# Patient Record
Sex: Male | Born: 1960 | Race: White | Hispanic: No | Marital: Married
Health system: Midwestern US, Academic
[De-identification: ages and names within clinical notes are randomized; demographics above are authoritative.]

---

## 2016-06-16 IMAGING — CT Head^1_SINUS (Adult)
1 series · 16 of 30 positions shown, 20 images · non-contrast
Comparison: none

[Series 4: sinus 3.0 soft tissue · axial · 0.40mm/px · z∈[+48,+135]mm · 16 of 31 slices shown, 20 images]
[im 2/31  brain]
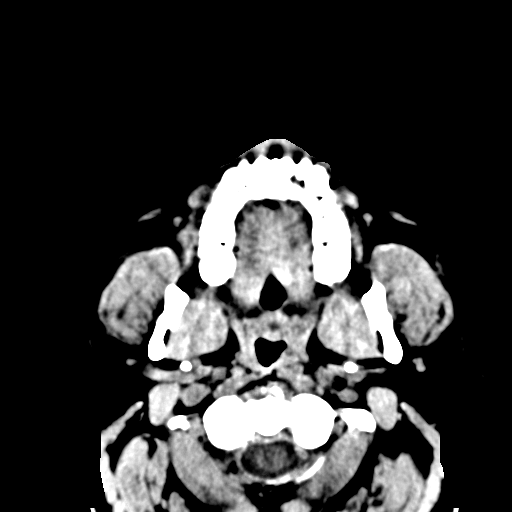
[im 2/31  bone]
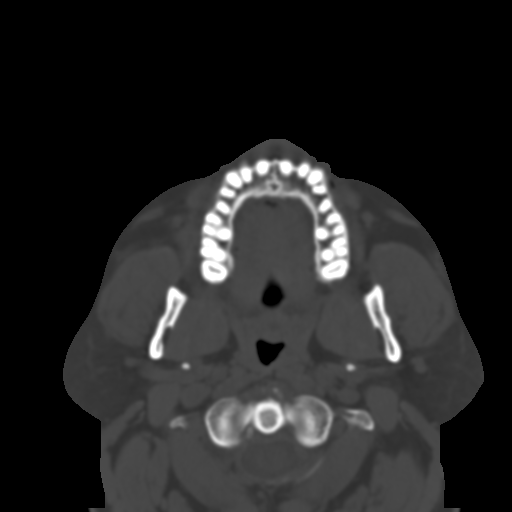
[im 4/31  bone]
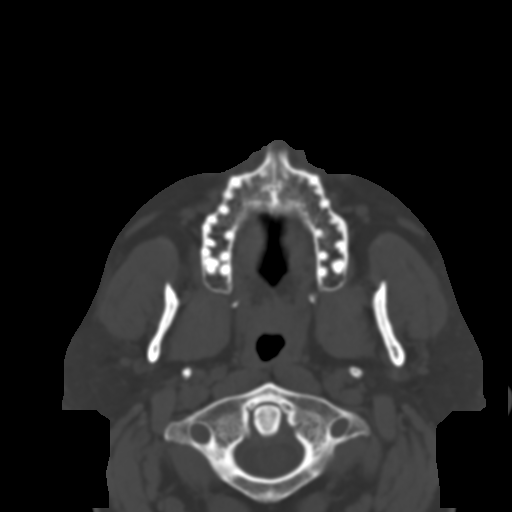
[im 6/31  bone]
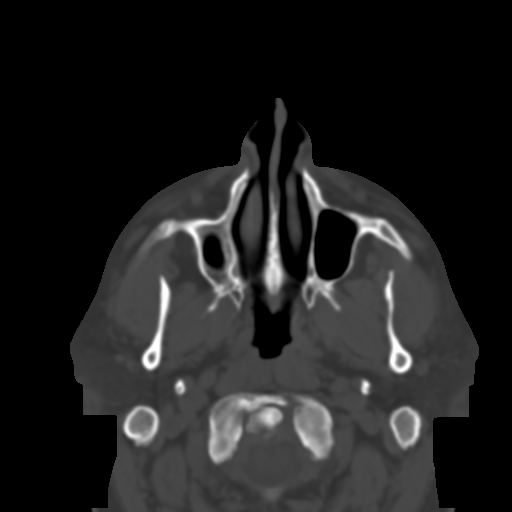
[im 8/31  bone]
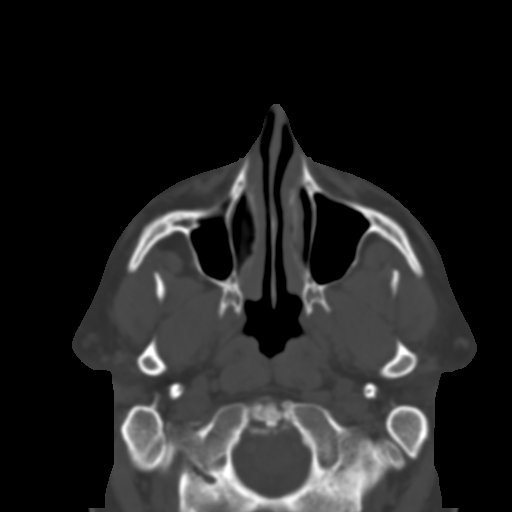
[im 9/31  brain]
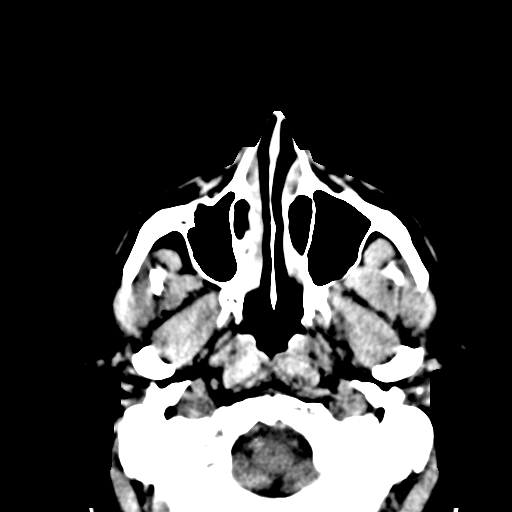
[im 9/31  bone]
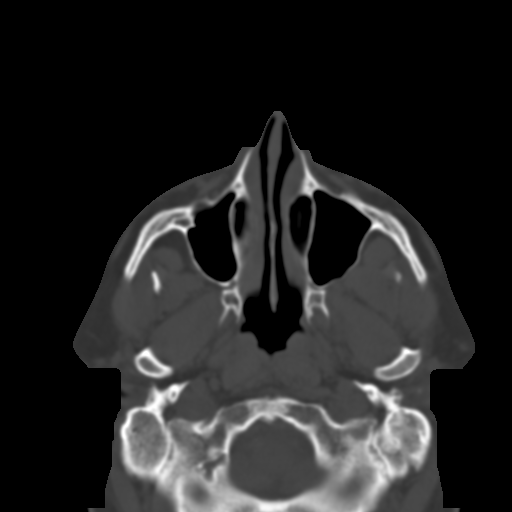
[im 11/31  bone]
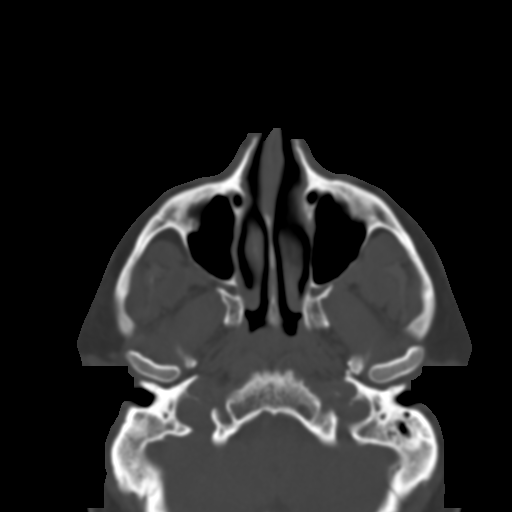
[im 13/31  bone]
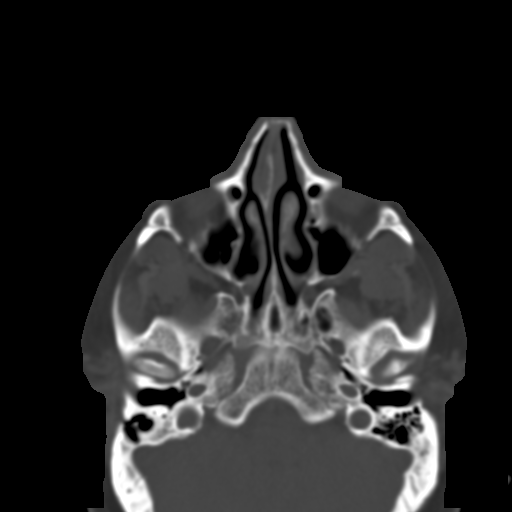
[im 15/31  bone]
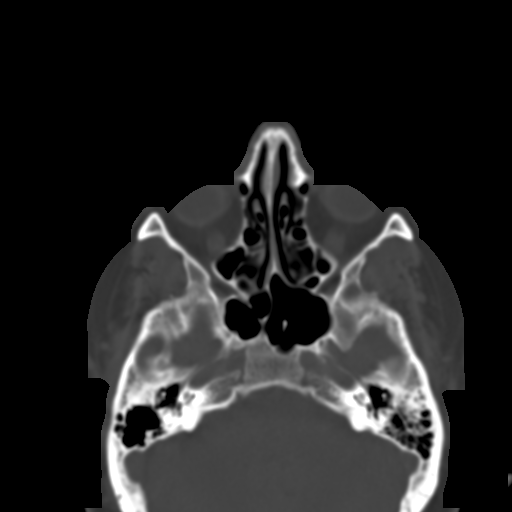
[im 16/31  brain]
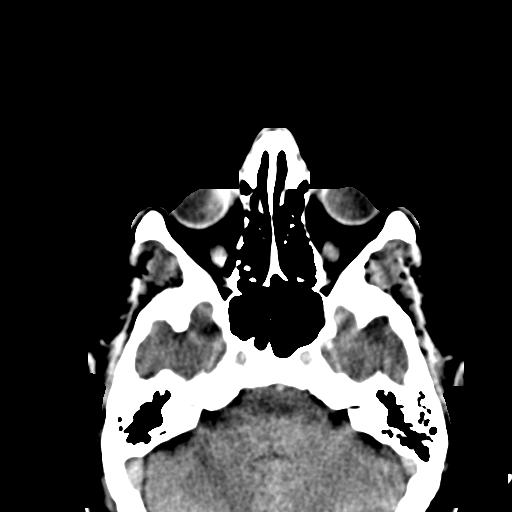
[im 16/31  bone]
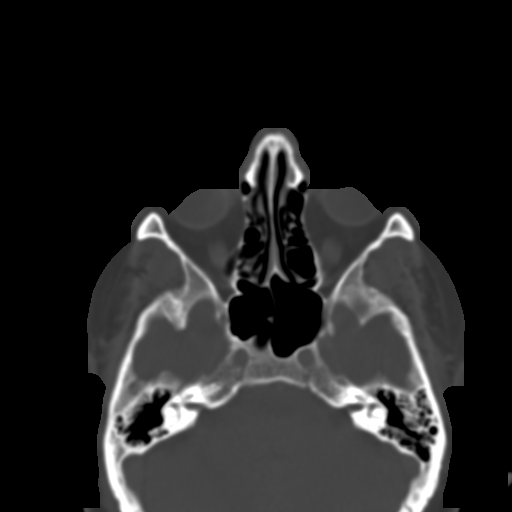
[im 18/31  bone]
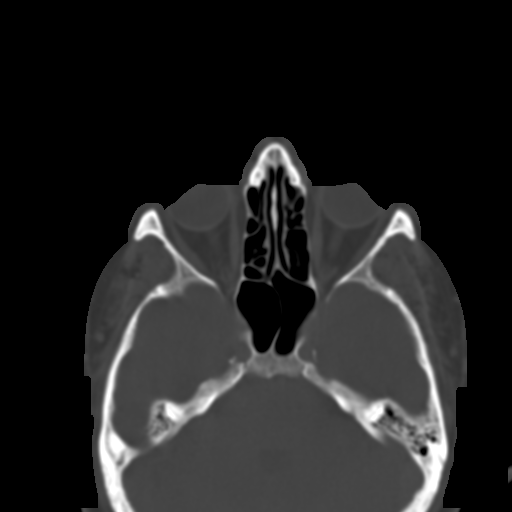
[im 20/31  bone]
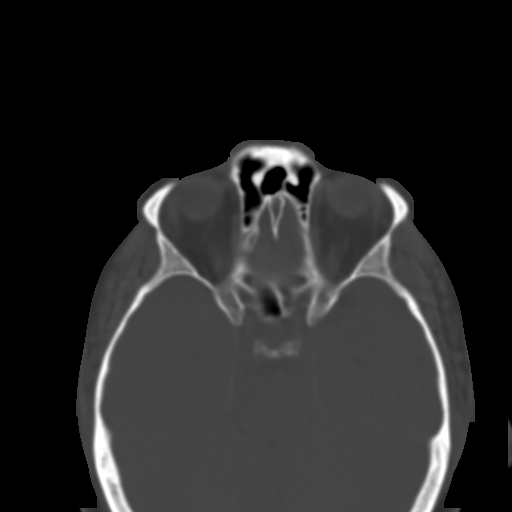
[im 22/31  bone]
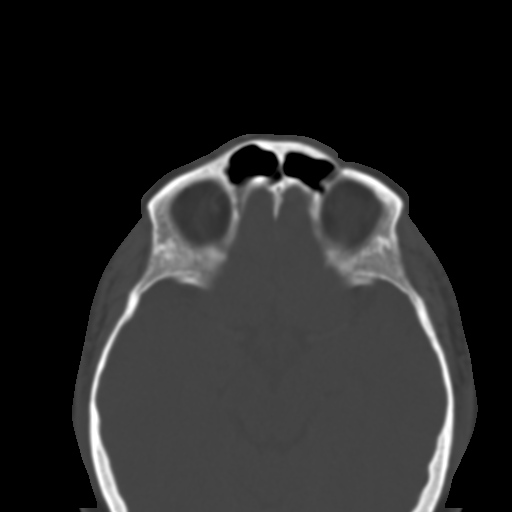
[im 23/31  brain]
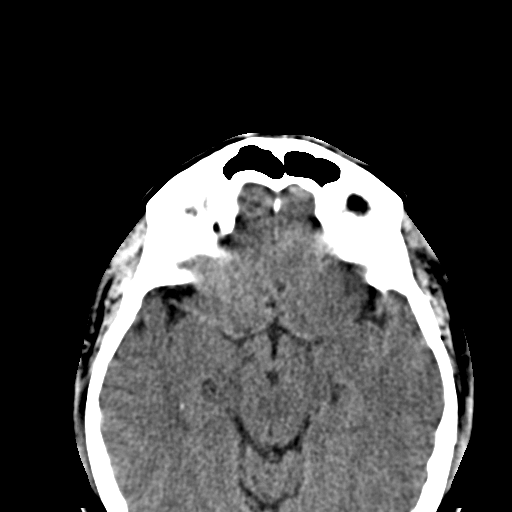
[im 23/31  bone]
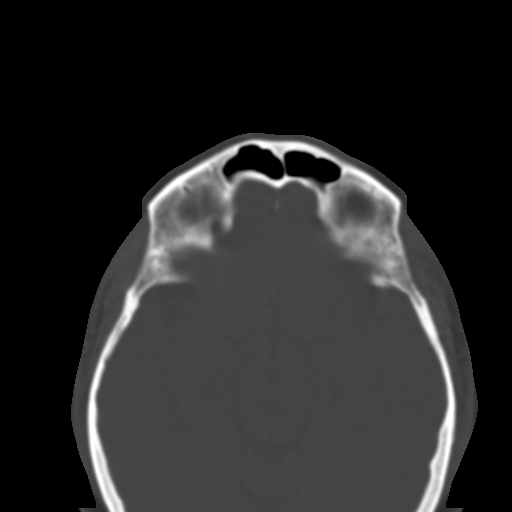
[im 25/31  bone]
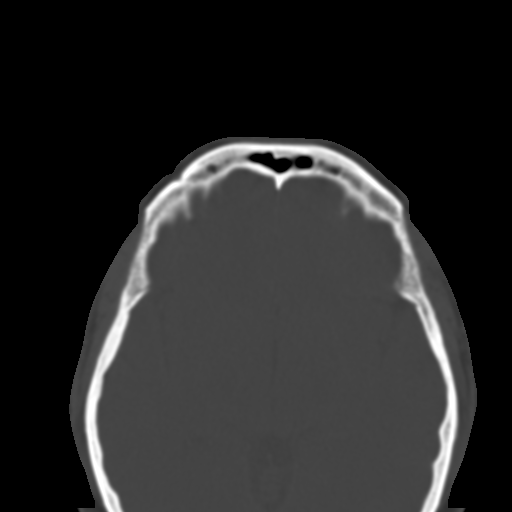
[im 27/31  bone]
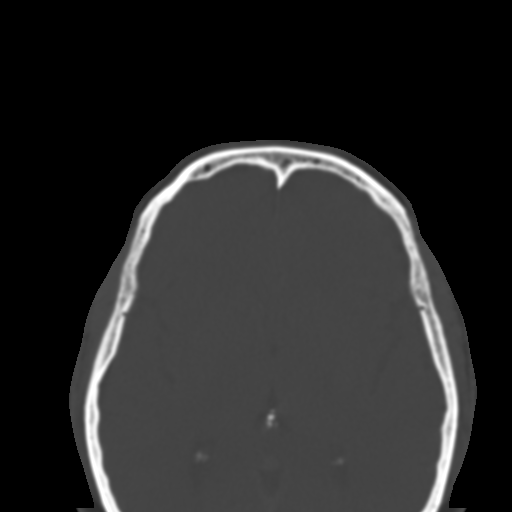
[im 29/31  bone]
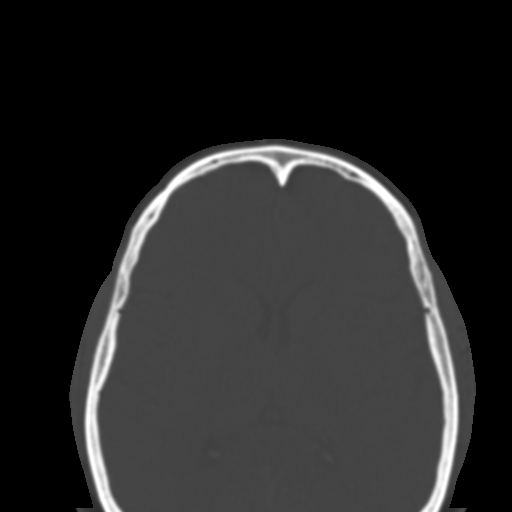

[16 of 30 positions shown; findings below may reference images not displayed]

DIAGNOSTIC STUDIES

EXAM

COMPUTED TOMOGRAPHY, MAXILLOFACIAL AREA; WITHOUT CONTRAST MATERIAL CPT 90331

INDICATION

chronic sinus congestion and pain
CHRONIC SINUS CONGESTION AND PAIN X 2 YEARS. CK/ME

TECHNIQUE

Contiguous axial images were obtained through the facial bones. Coronal and sagittal images were
reconstructed.

All CT scans at this facility use dose modulation, iterative reconstruction, and/or weight based
dosing when appropriate to reduce radiation dose to as low as reasonably achievable.

COMPARISONS

No priors available for comparison.

FINDINGS

The frontal sinuses, ethmoid air cells and sphenoid sinuses are well aerated. The maxillary
sinuses are well aerated without evidence of mucosal thickening or air fluid levels.  The
osteomeatal units are visualized. The infundibula are patent. There is mild deviation of the nasal
septum to the right, 4 millimeter. The mastoid air cells appear well aerated.

IMPRESSION

1. No evidence of acute or chronic sinusitis.

## 2016-10-16 IMAGING — CR UP_EXM
4 series · 4 of 4 positions shown · non-contrast
Comparison: none

[elbow ap]
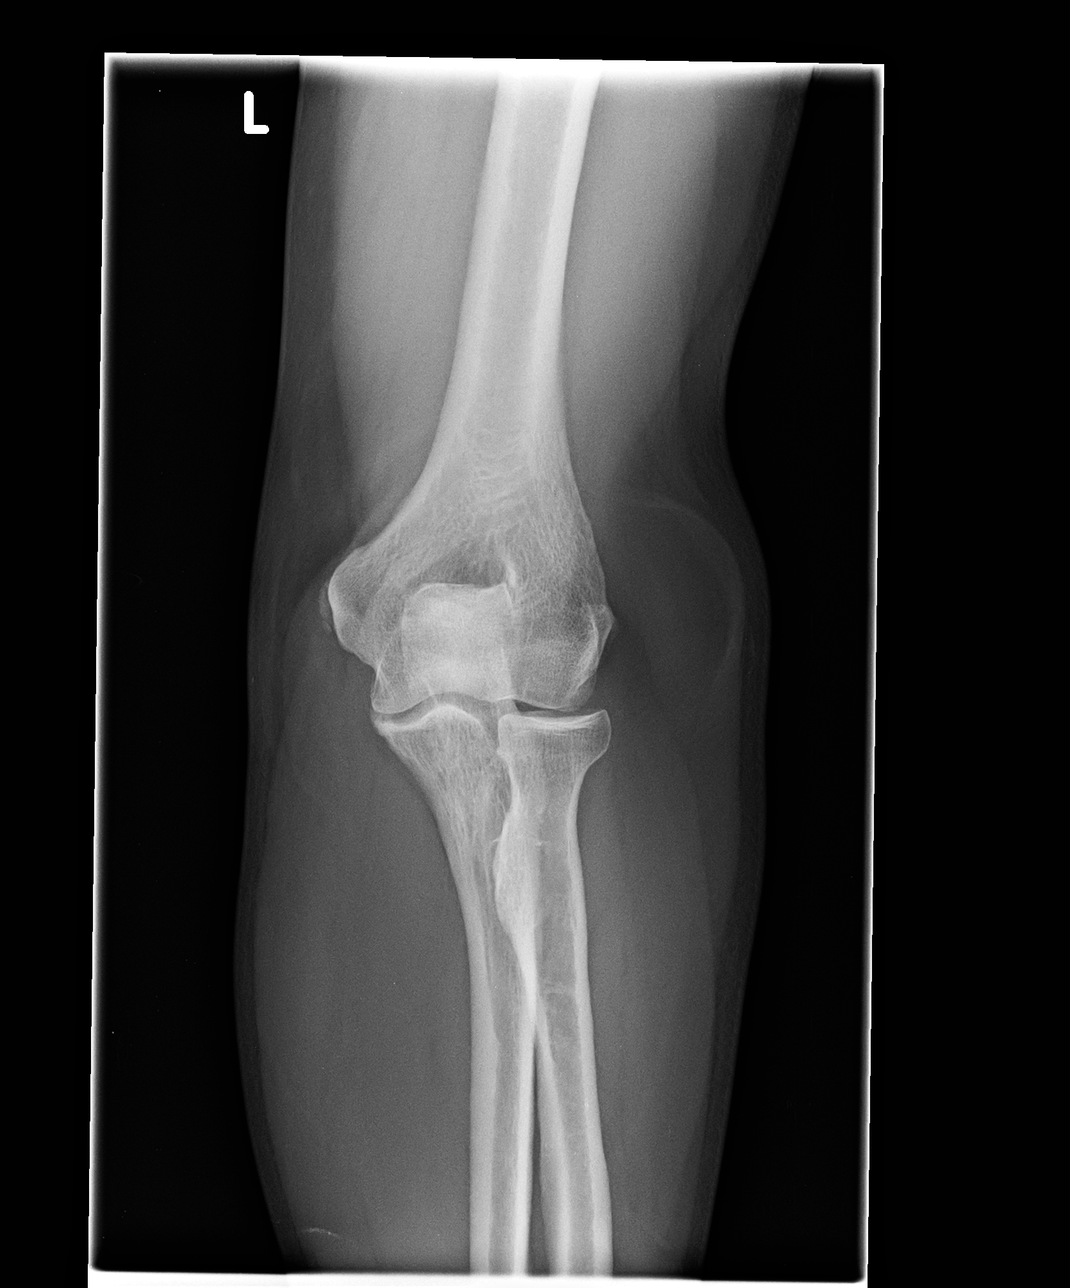

[elbow obl (1 of 2)]
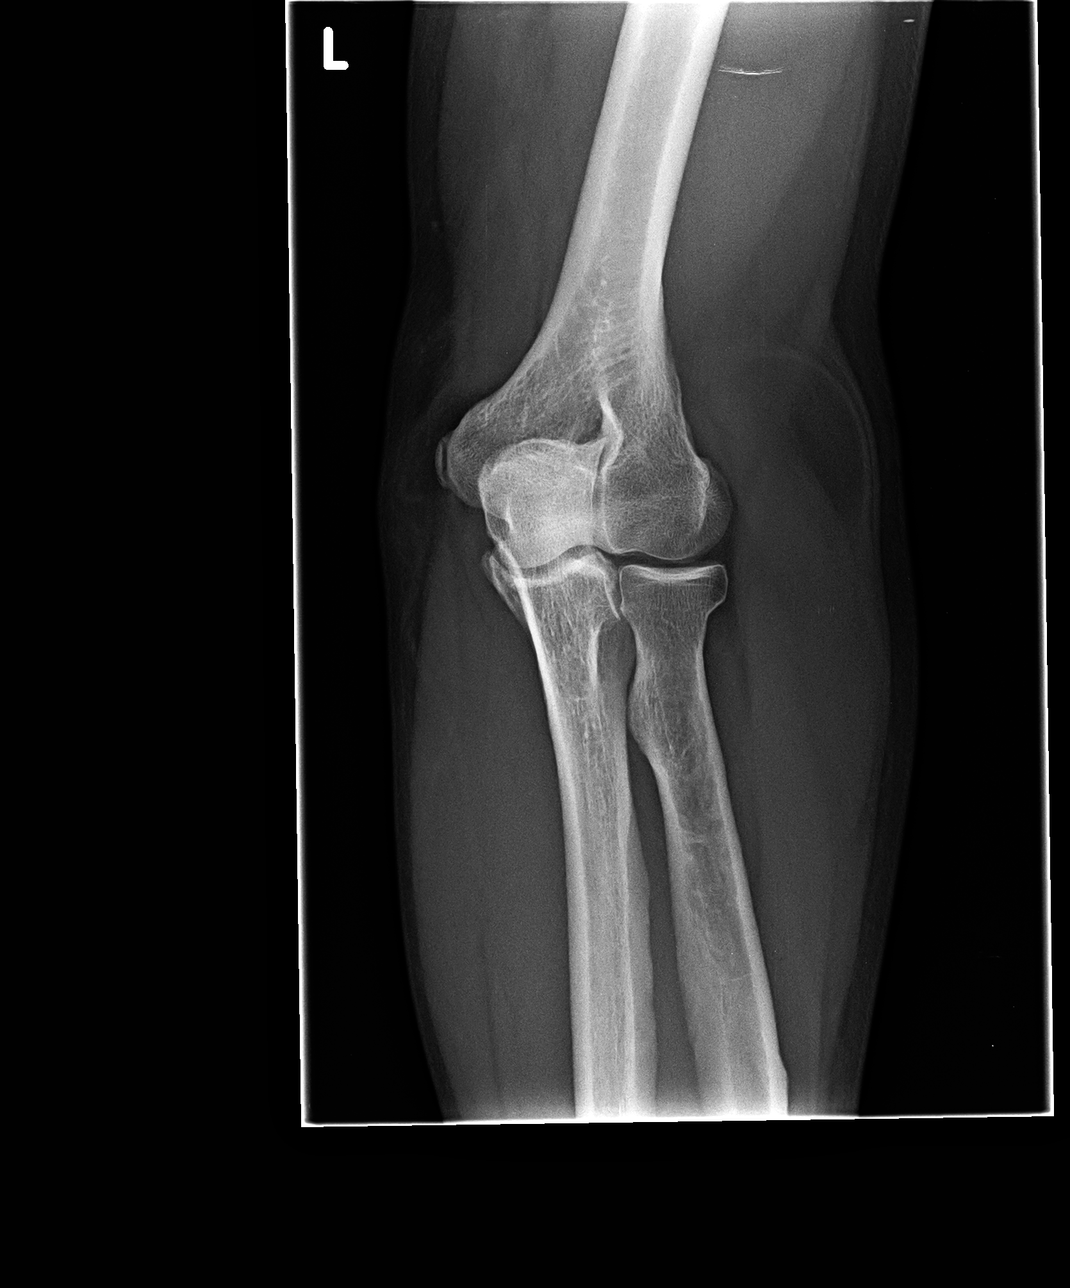

[elbow obl (2 of 2)]
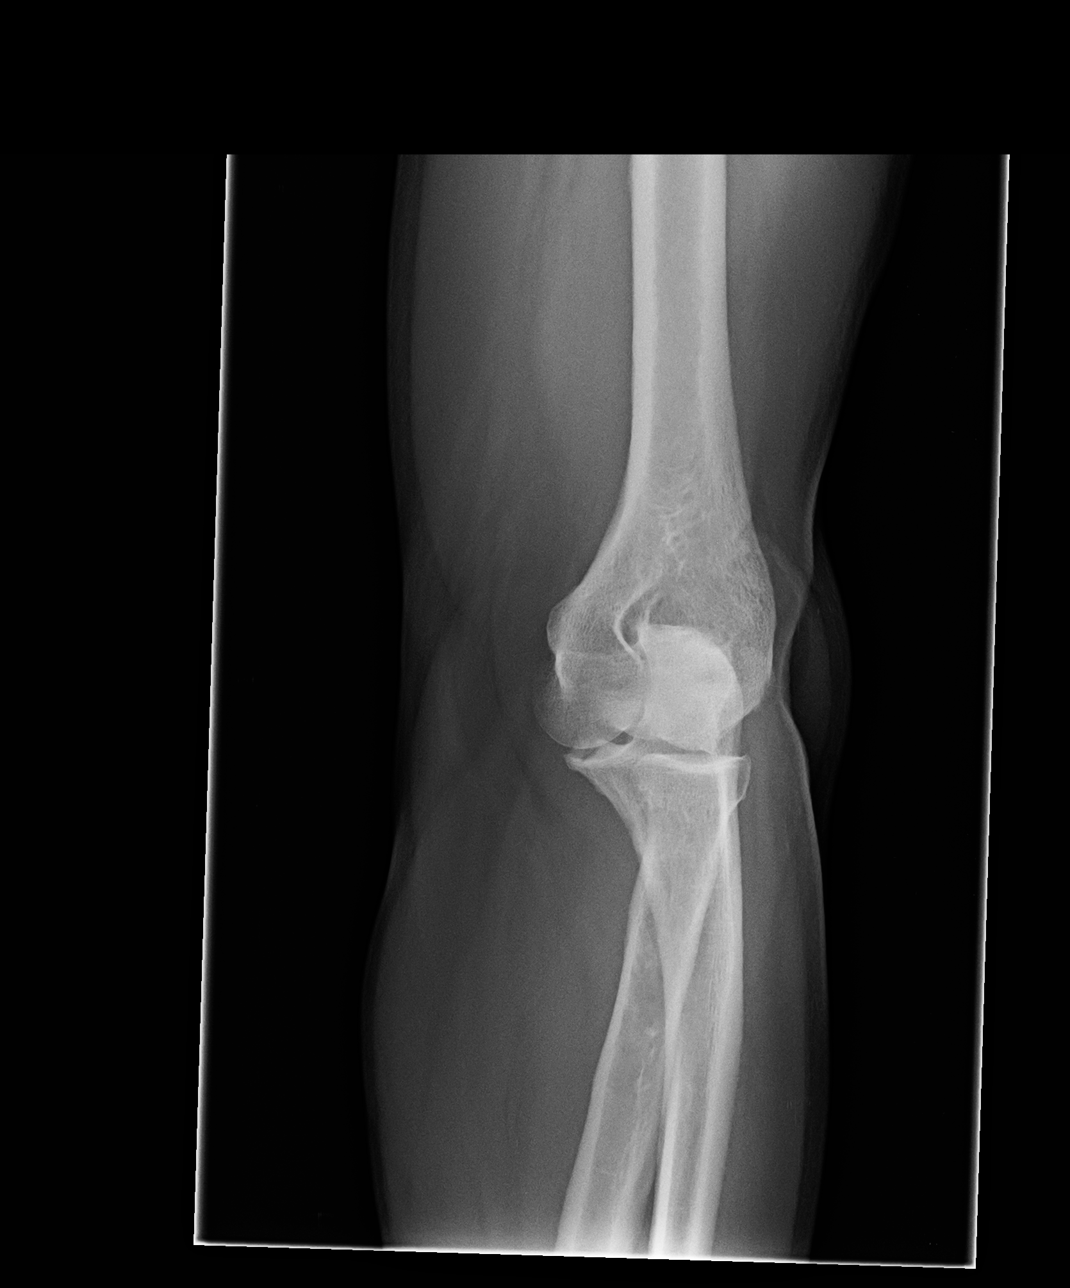

[elbow lat]
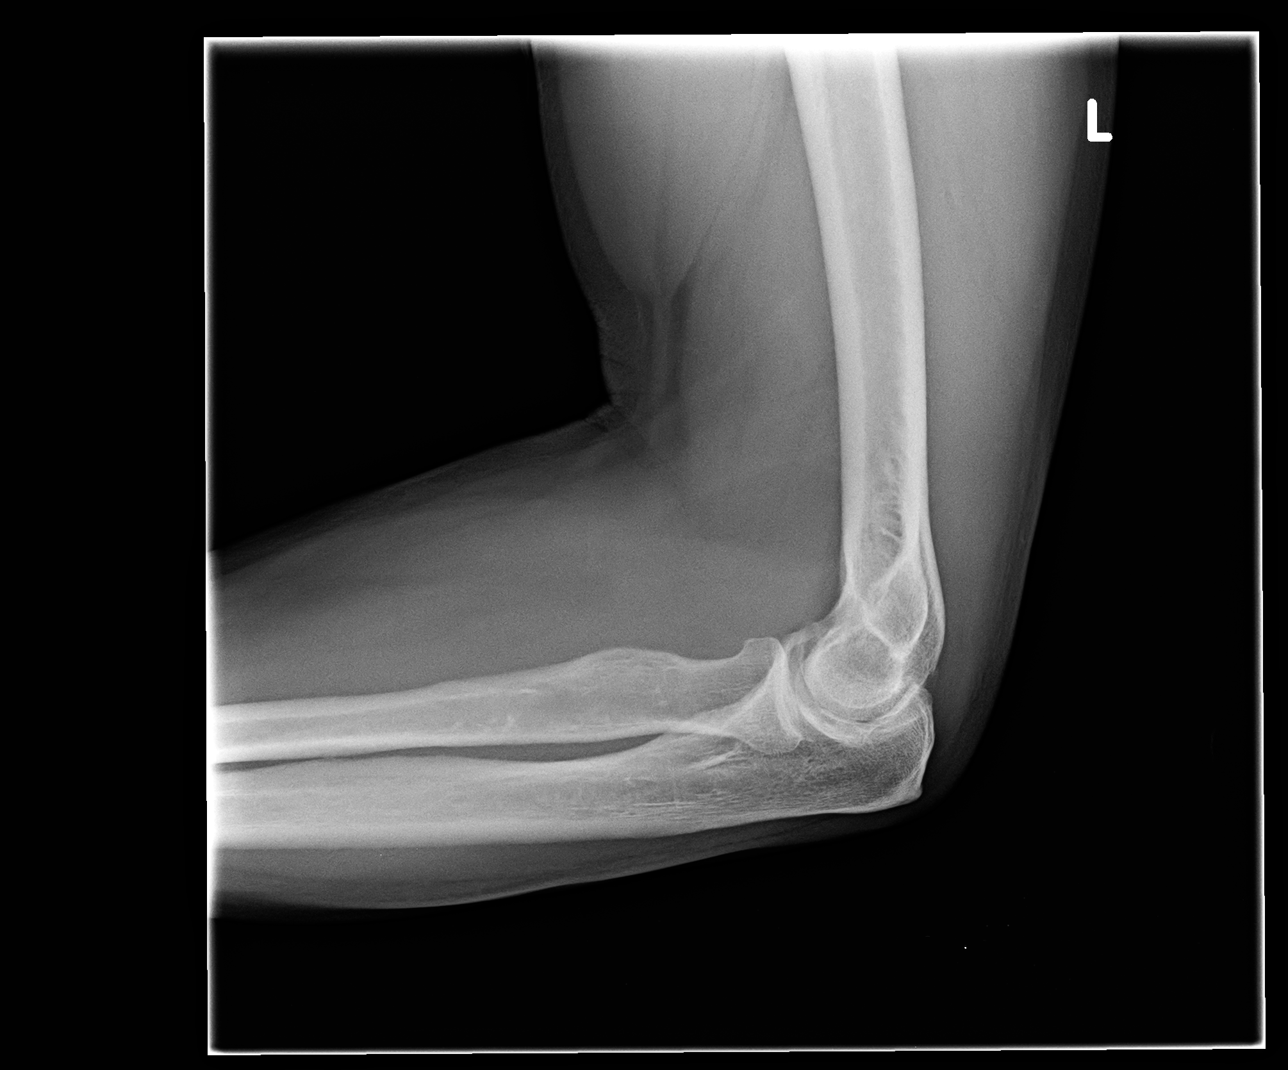

[4 of 4 positions shown; findings below may reference images not displayed]

DIAGNOSTIC STUDIES

EXAM
4 VIEW LEFT ELBOW

INDICATION
left elbow pain
PT. FELT POP IN ELBOW X1 WEEK AGO. PAIN WITH ACTIVITY SINCE.

TECHNIQUE
4 views of the left elbow were submitted.

COMPARISONS
None

FINDINGS
There is no elbow joint effusion. No displaced fracture or malalignment is detected. There is no
joint space loss. No destructive bone lesion is identified. Enthesophyte medial humeral epicondyle
common flexor origin.

IMPRESSION
No acute bony abnormality and no joint effusion. Medial humeral epicondyle enthesophyte origin
common flexor tendon.

## 2017-11-13 IMAGING — CR LOW_EXM
3 series · 3 of 3 positions shown · non-contrast
Comparison: none

[foot]
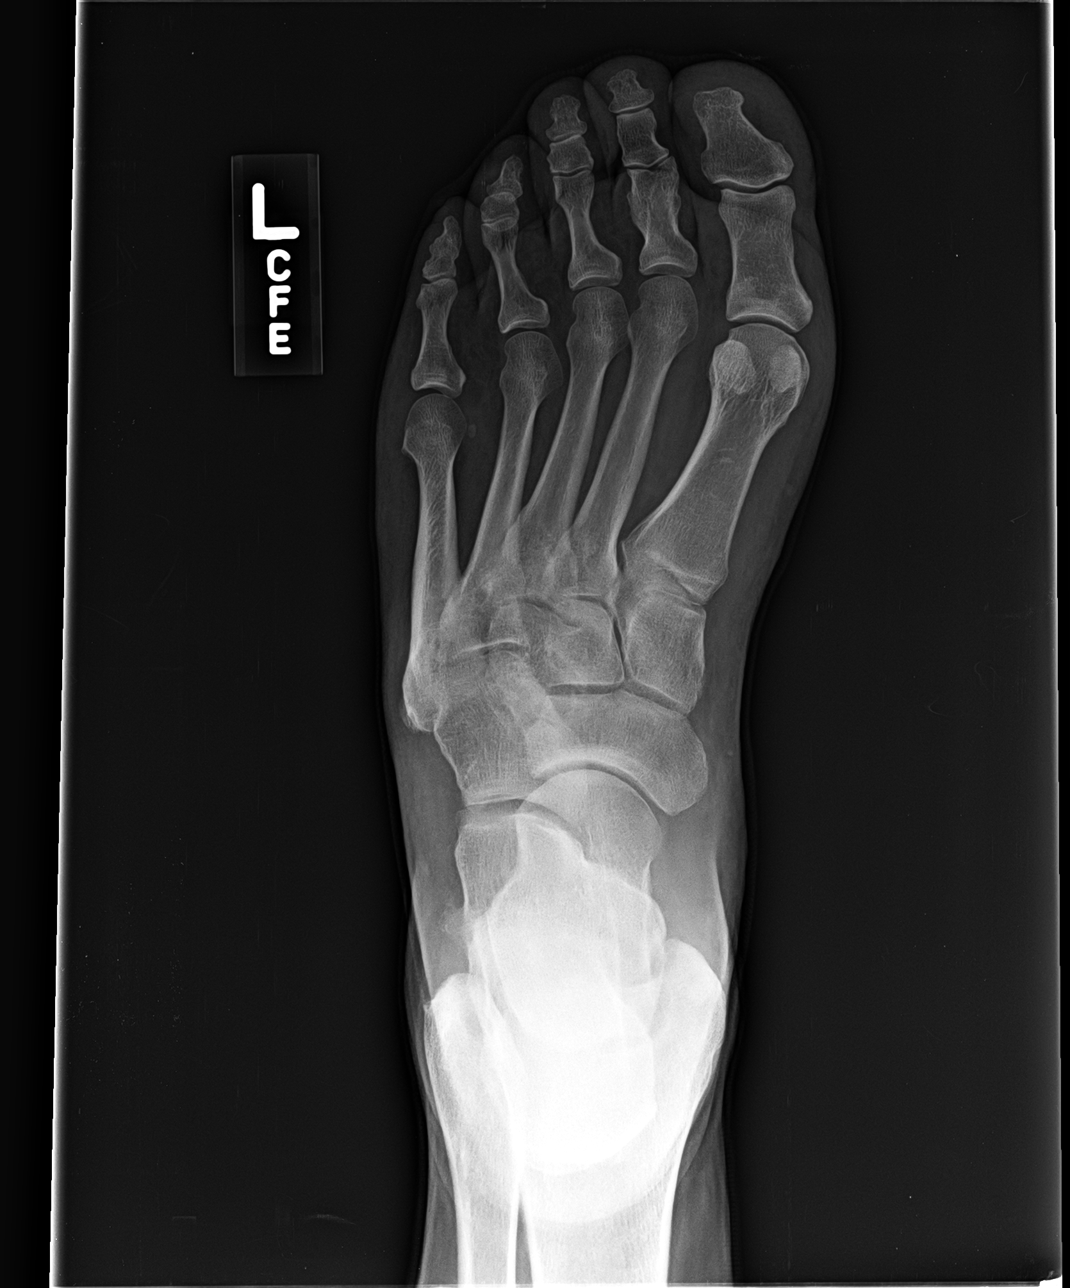

[foot obl]
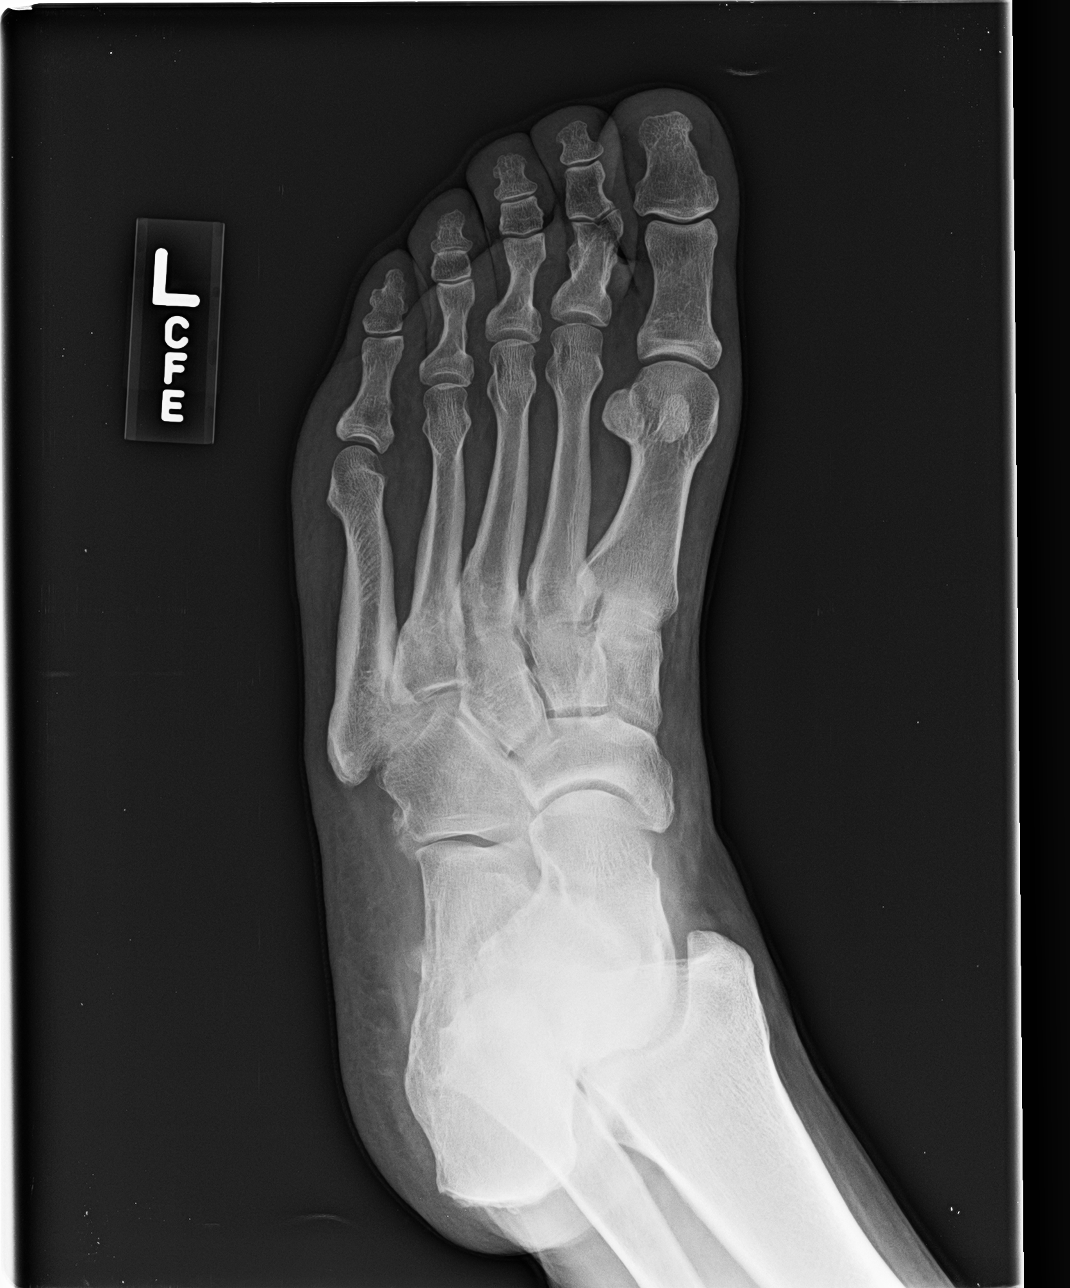

[foot lat]
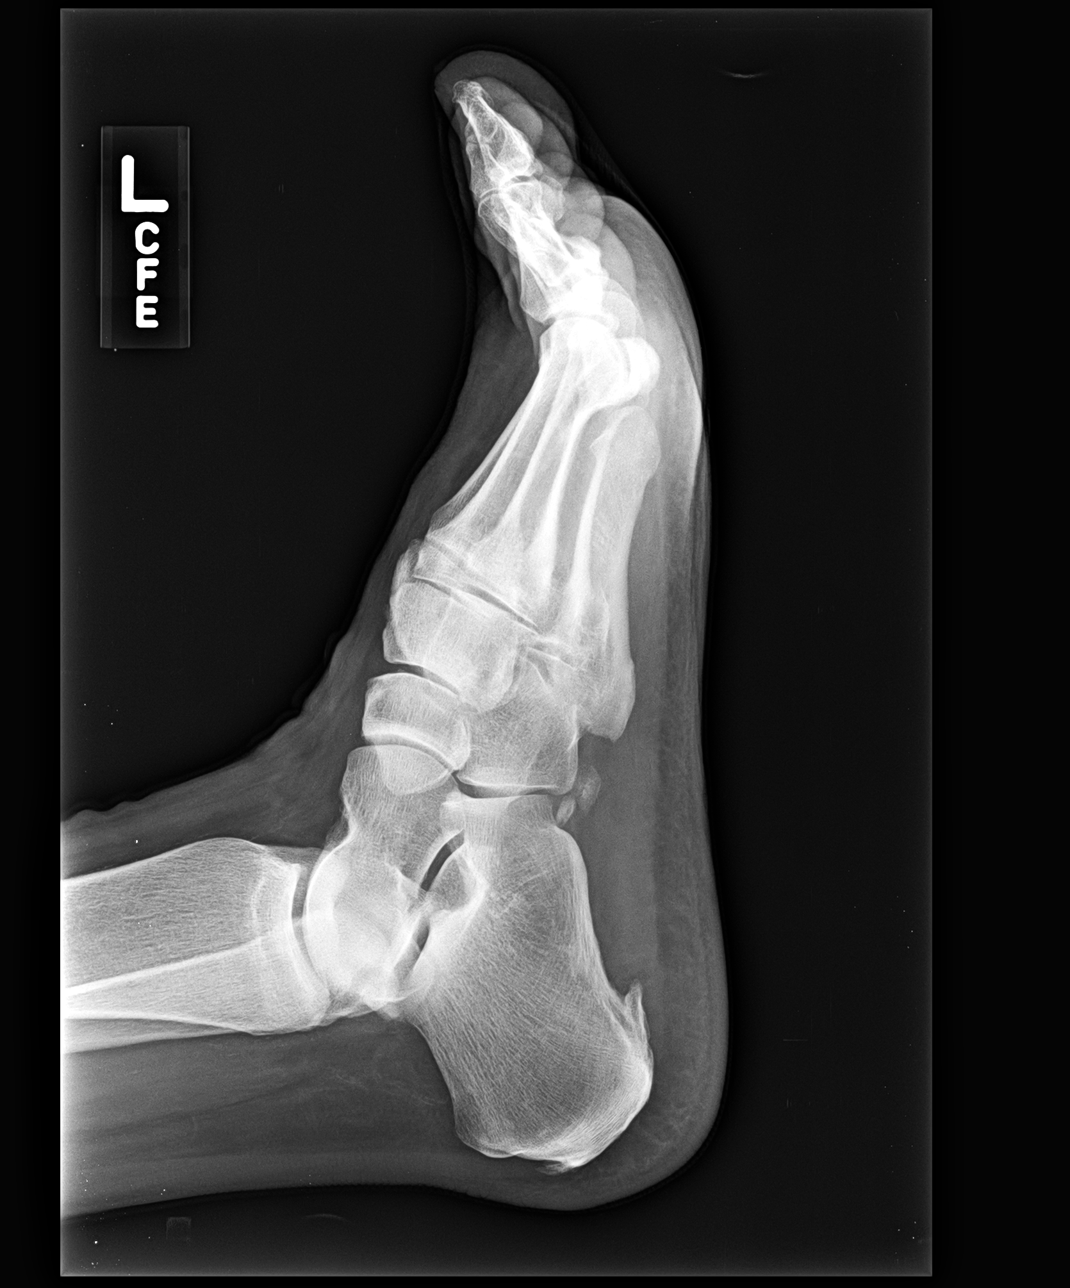

[3 of 3 positions shown; findings below may reference images not displayed]

EXAM
3 views left foot

INDICATION
enlarging mass left dorsal foot.
pt. c/o lump centered over base of 4th and 5th metatarsal.

TECHNIQUE
AP, lateral, and oblique views were obtained

COMPARISONS
None available.

FINDINGS
There is no acute fracture, dislocation, or other acute osseus abnormality. The soft tissues are
unremarkable. There is a small heel spur. There is a small likely accessory ossicle over the distal
tarsal joints seen best on the lateral.

IMPRESSION
1. Moderate degenerative changes without evidence of definite displaced fracture. Noted mass lesion
corresponds to an accessory ossicle versus osteophyte developing within the tarsometatarsal joints..

Tech Notes:

pt. c/o lump centered over base of 4th and 5th metatarsal.

## 2019-02-14 ENCOUNTER — Encounter: Admit: 2019-02-14 | Discharge: 2019-02-14

## 2019-02-14 ENCOUNTER — Encounter: Admit: 2019-02-14 | Discharge: 2019-02-15

## 2019-02-14 DIAGNOSIS — R030 Elevated blood-pressure reading, without diagnosis of hypertension: Secondary | ICD-10-CM

## 2019-02-14 DIAGNOSIS — N201 Calculus of ureter: Secondary | ICD-10-CM

## 2019-02-14 DIAGNOSIS — R69 Illness, unspecified: Secondary | ICD-10-CM

## 2019-02-14 DIAGNOSIS — N200 Calculus of kidney: Secondary | ICD-10-CM

## 2019-02-14 DIAGNOSIS — Z9981 Dependence on supplemental oxygen: Secondary | ICD-10-CM

## 2019-02-14 DIAGNOSIS — E079 Disorder of thyroid, unspecified: Secondary | ICD-10-CM

## 2019-02-14 DIAGNOSIS — I10 Essential (primary) hypertension: Secondary | ICD-10-CM

## 2019-02-14 LAB — URINALYSIS DIPSTICK REFLEX TO CULTURE
Lab: NEGATIVE K/UL (ref 3–12)
Lab: NEGATIVE MMOL/L (ref 21–30)
Lab: NEGATIVE U/L (ref 25–110)
Lab: NEGATIVE U/L (ref 7–56)
Lab: NEGATIVE g/dL (ref 3.5–5.0)

## 2019-02-14 LAB — URINALYSIS MICROSCOPIC REFLEX TO CULTURE

## 2019-02-14 LAB — CBC AND DIFF
Lab: 0 10*3/uL (ref 0–0.20)
Lab: 0.1 10*3/uL (ref 0–0.45)
Lab: 8.7 10*3/uL (ref 4.5–11.0)

## 2019-02-14 LAB — COMPREHENSIVE METABOLIC PANEL
Lab: 137 MMOL/L (ref 137–147)
Lab: 35 mL/min — ABNORMAL LOW (ref >60)
Lab: 42 mL/min — ABNORMAL LOW (ref >60)

## 2019-02-14 LAB — LIPASE: Lab: 13 U/L (ref 11–82)

## 2019-02-14 IMAGING — CT STONE PROTOCOL(Adult)
2 of 3 series · 14 of 46 positions shown, 16 images · non-contrast
Comparison: none

[Series 2: abdomen ax 3.00 br40 s3 · axial · 0.68mm/px · z∈[+1172,+1580]mm · 11 of 127 slices shown, 13 images]
[im 9/127  soft-tissue]
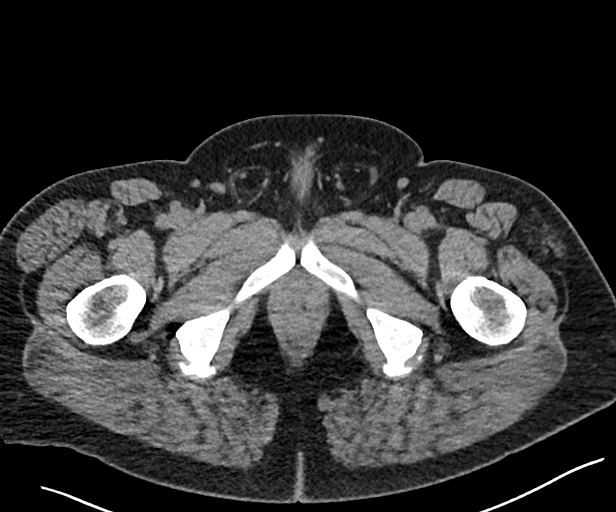
[im 9/127  bone]
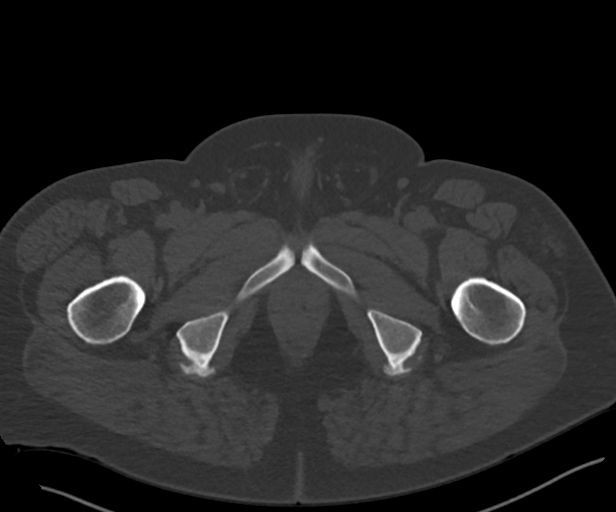
[im 21/127  soft-tissue]
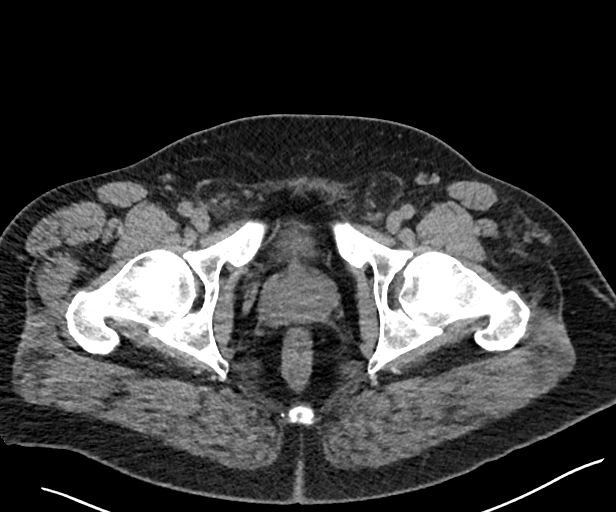
[im 29/127  soft-tissue]
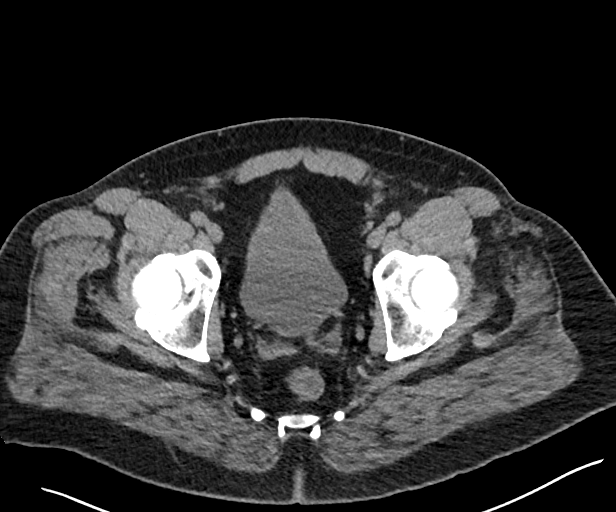
[im 41/127  soft-tissue]
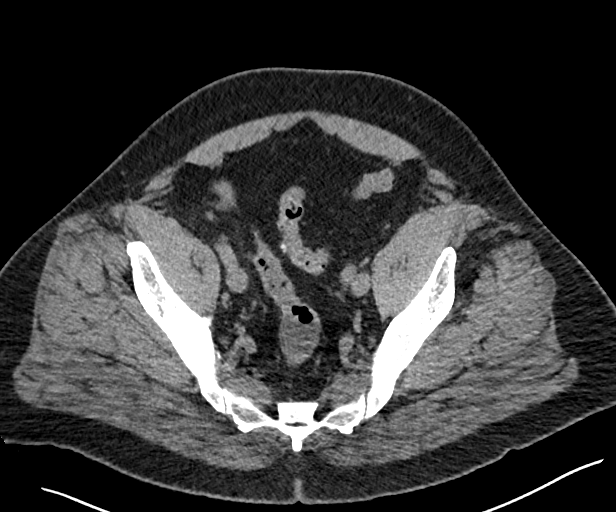
[im 53/127  soft-tissue]
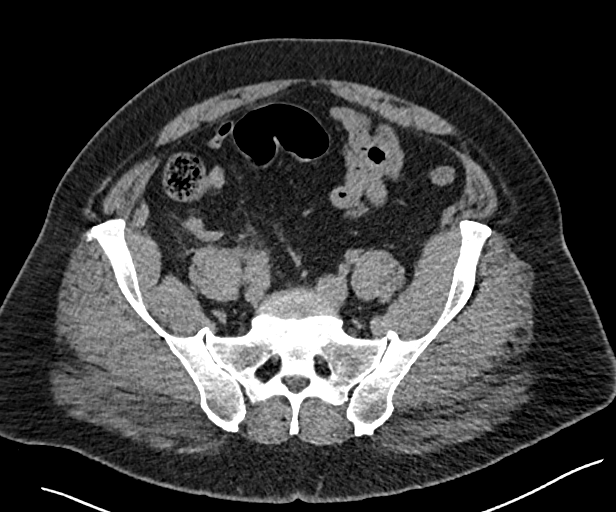
[im 66/127  soft-tissue]
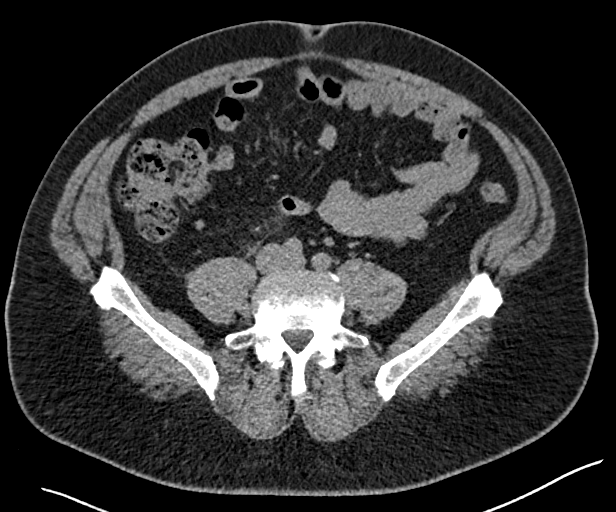
[im 74/127  soft-tissue]
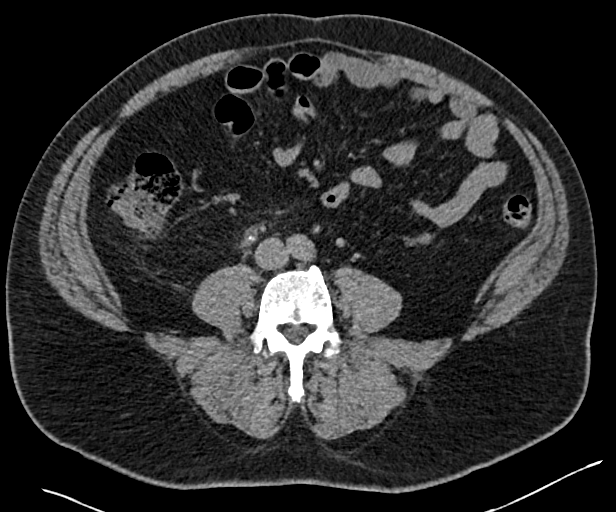
[im 86/127  soft-tissue]
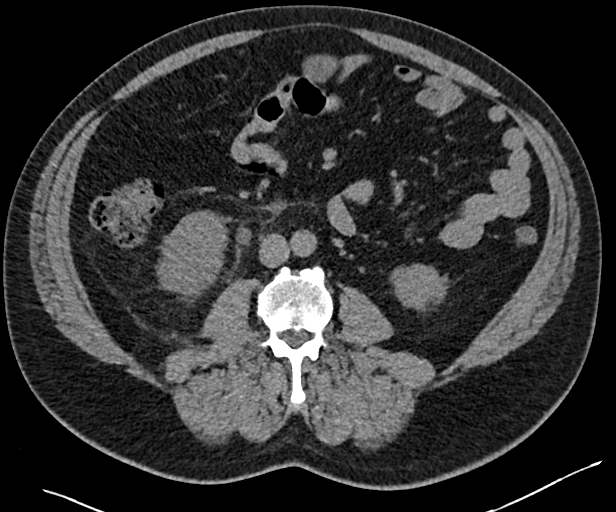
[im 98/127  soft-tissue]
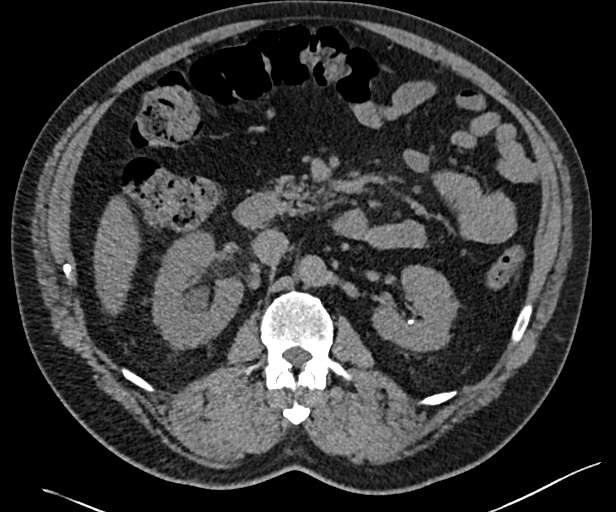
[im 98/127  bone]
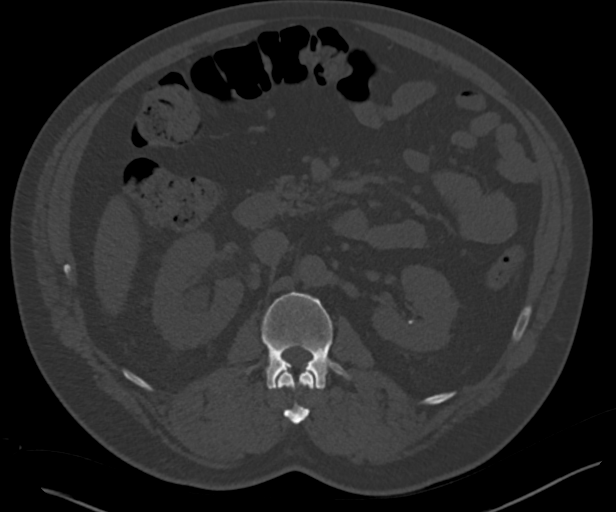
[im 106/127  soft-tissue]
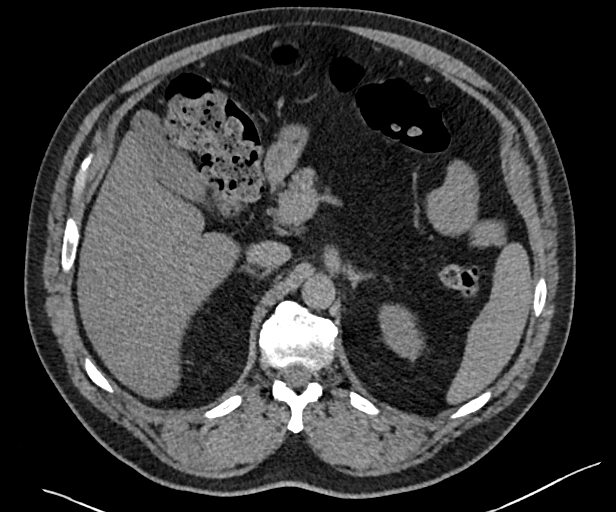
[im 118/127  soft-tissue]
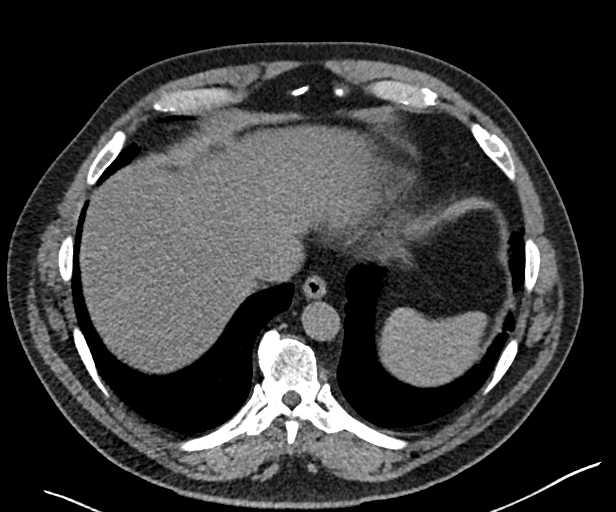

[Series 4: abdomen cor 3.00 br40 s3 · coronal · 0.81mm/px · 3 of 114 slices shown]
[im 38/114  soft-tissue]
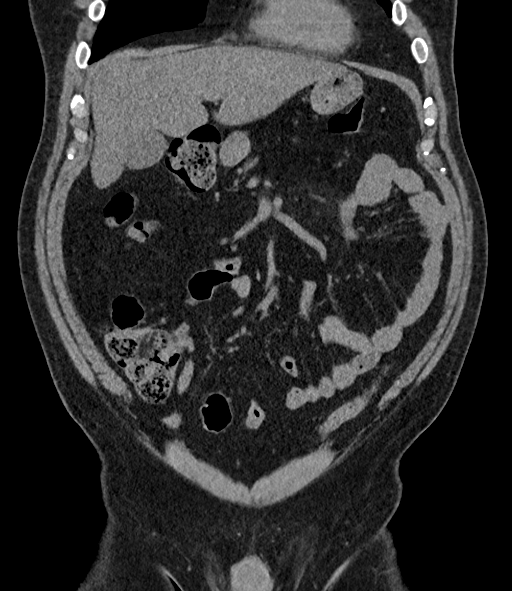
[im 51/114  soft-tissue]
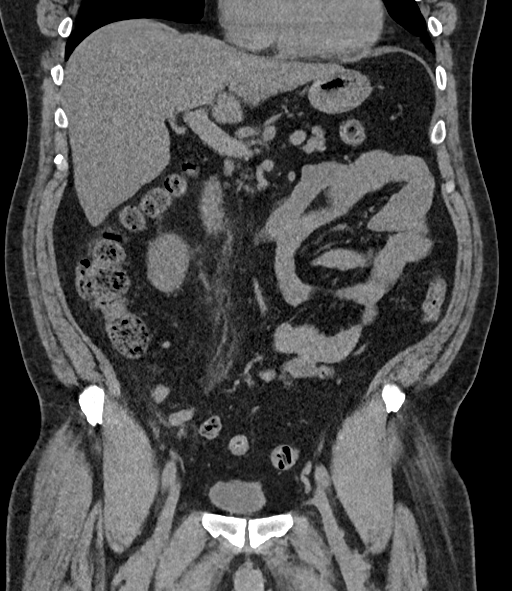
[im 63/114  soft-tissue]
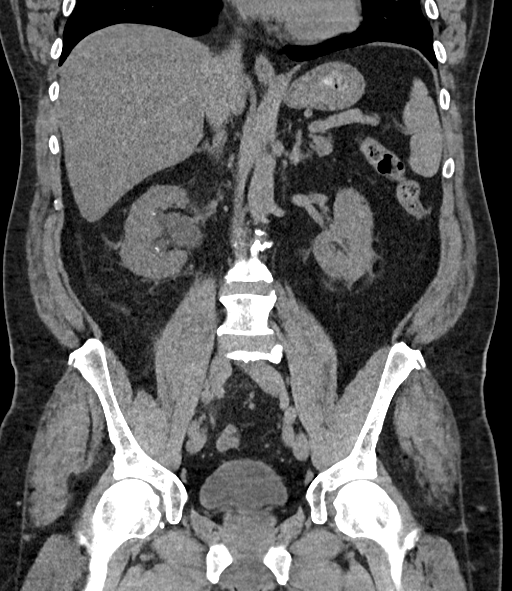

[14 of 46 positions shown; findings below may reference images not displayed]

DIAGNOSTIC STUDIES

EXAM

CT ABDOMEN AND PELVIS WITHOUT CONTRAST

INDICATION

right flank pain
RT FLANK PAIN, 2/0 CT/NM  RG/AB

TECHNIQUE

Contiguous transaxial imaging through the abdomen and pelvis was performed in the absence of
intravenous iodinated contrast material. Coronal and sagittal reformations were obtained from the
transaxial source data and created by the CT technologist at the workstation.

All CT scans at this facility use dose modulation, iterative reconstruction, and/or weight based
dosing when appropriate to reduce radiation dose to as low as reasonably achievable.

COMPARISONS

No comparison is available

Number of previous computed tomography exams in the last 12 months is 0 .

Number of previous nuclear medicine myocardial perfusion studies in the last 12 months is 0 .

FINDINGS

The lung bases are clear and the heart size is normal. No free intraperitoneal gas is detected.

Evaluation of the solid abdominopelvic viscera is limited in the absence of intravenous iodinated
contrast material. The nonopacified liver and spleen are normal in size and contour. Diffuse hepatic
steatosis. There is no radiopaque cholelithiasis or biliary ductal dilatation. No adrenal masses and
no peripancreatic fat stranding is demonstrated.

Mildly obstructing mid right ureteral stones measures up to 7.5 millimeter. Additional
nonobstructing bilateral nephrolithiasis.

The non-opacified abdominal aorta and inferior vena cava are normal caliber. The large and small
bowel loops are normal caliber. The non-opacified urinary bladder is distended. The non-opacified
prostate gland is normal size. The large and small bowel loops are normal caliber. No abdominopelvic
ascites or inflammatory mass is identified. Minimal colonic diverticulosis. Normal appendix.

No destructive bone lesion is identified.

IMPRESSION

Mildly obstructing mid right ureterolithiasis and additional nonobstructing bilateral
nephrolithiasis.

Tech Notes:

RT FLANK PAIN, 2/0 CT/NM  RG/AB

## 2019-02-14 MED ORDER — HEPARIN, PORCINE (PF) 5,000 UNIT/0.5 ML IJ SYRG
5000 [IU] | Freq: Two times a day (BID) | SUBCUTANEOUS | 0 refills | Status: DC
Start: 2019-02-14 — End: 2019-02-15

## 2019-02-14 MED ORDER — MORPHINE 2 MG/ML IV SYRG
4 mg | Freq: Once | INTRAVENOUS | 0 refills | Status: CP
Start: 2019-02-14 — End: ?
  Administered 2019-02-15: 4 mg via INTRAVENOUS

## 2019-02-14 MED ORDER — ONDANSETRON HCL (PF) 4 MG/2 ML IJ SOLN
4 mg | INTRAVENOUS | 0 refills | Status: DC | PRN
Start: 2019-02-14 — End: 2019-02-16

## 2019-02-14 MED ORDER — ACETAMINOPHEN 325 MG PO TAB
650 mg | ORAL | 0 refills | Status: DC | PRN
Start: 2019-02-14 — End: 2019-02-16
  Administered 2019-02-15 – 2019-02-16 (×2): 650 mg via ORAL

## 2019-02-14 MED ORDER — ONDANSETRON HCL (PF) 4 MG/2 ML IJ SOLN
4 mg | Freq: Once | INTRAVENOUS | 0 refills | Status: CP
Start: 2019-02-14 — End: ?
  Administered 2019-02-14: 23:00:00 4 mg via INTRAVENOUS

## 2019-02-14 MED ORDER — OXYCODONE 5 MG PO TAB
5-10 mg | ORAL | 0 refills | Status: DC | PRN
Start: 2019-02-14 — End: 2019-02-16
  Administered 2019-02-15 (×4): 5 mg via ORAL
  Administered 2019-02-16 (×3): 10 mg via ORAL

## 2019-02-14 MED ORDER — LEVOTHYROXINE 50 MCG PO TAB
50 ug | Freq: Every day | ORAL | 0 refills | Status: DC
Start: 2019-02-14 — End: 2019-02-16
  Administered 2019-02-15 – 2019-02-16 (×2): 50 ug via ORAL

## 2019-02-14 MED ORDER — LACTATED RINGERS IV SOLP
1000 mL | INTRAVENOUS | 0 refills | Status: CP
Start: 2019-02-14 — End: ?
  Administered 2019-02-15: 04:00:00 1000 mL via INTRAVENOUS

## 2019-02-14 MED ORDER — LACTATED RINGERS IV SOLP
INTRAVENOUS | 0 refills | Status: DC
Start: 2019-02-14 — End: 2019-02-15
  Administered 2019-02-15: 05:00:00 1000.000 mL via INTRAVENOUS

## 2019-02-14 MED ORDER — LACTATED RINGERS IV SOLP
1000 mL | INTRAVENOUS | 0 refills | Status: CP
Start: 2019-02-14 — End: ?
  Administered 2019-02-14: 23:00:00 1000 mL via INTRAVENOUS

## 2019-02-14 MED ORDER — TAMSULOSIN 0.4 MG PO CAP
0.4 mg | Freq: Every day | ORAL | 0 refills | Status: DC
Start: 2019-02-14 — End: 2019-02-16
  Administered 2019-02-15 – 2019-02-16 (×2): 0.4 mg via ORAL

## 2019-02-14 MED ORDER — FENTANYL CITRATE (PF) 50 MCG/ML IJ SOLN
50 ug | Freq: Once | INTRAVENOUS | 0 refills | Status: CP
Start: 2019-02-14 — End: ?
  Administered 2019-02-14: 23:00:00 50 ug via INTRAVENOUS

## 2019-02-14 MED ORDER — FENTANYL CITRATE (PF) 50 MCG/ML IJ SOLN
25-50 ug | INTRAVENOUS | 0 refills | Status: DC | PRN
Start: 2019-02-14 — End: 2019-02-16

## 2019-02-14 MED ORDER — ONDANSETRON HCL 8 MG PO TAB
4 mg | ORAL | 0 refills | Status: DC | PRN
Start: 2019-02-14 — End: 2019-02-16
  Administered 2019-02-16: 15:00:00 4 mg via ORAL

## 2019-02-14 MED ORDER — POLYETHYLENE GLYCOL 3350 17 GRAM PO PWPK
1 | Freq: Every day | ORAL | 0 refills | Status: DC
Start: 2019-02-14 — End: 2019-02-16

## 2019-02-14 NOTE — ED Notes
Pt ambulated to ED 47 with a steady gait. Pt presents with c/o kidney stone. Pt reports right sided flank pain since Wednesday. Pt went to ED in [place] and had CT scan done that showed a partially obstructive kidney stone in his right ureter. Pt reports N/V, but denies fever/chills and urinary symptoms including hematuria.

## 2019-02-14 NOTE — ED Provider Notes
Grant Olson is a 58 y.o. male.    Chief Complaint:  Chief Complaint   Patient presents with   ??? Abnormal Test     Pt had CT scan today at [facility] which showed mildy obstructing right kidney stone       History of Present Illness:  Grant Olson is a 59 y.o. male, with a history of HTN and hypothyroidism, who presents to the emergency department for kidney stone. Patient with complaints of right flank pain for the past 2 days with associated nausea and vomiting. He was seen at [facility] today with CT scan resulting 7.5cm stone mildly obstructing mid right ureterolithiasis and additional non obstructing nephrolithiasis. Patient was instructed to come to [facility] ED for further management. Otherwise denies any fever, chills, coughs, CP, SOB, abdominal pain, diarrhea, difficulty urinating, dysuria, hematuria, urinary frequency/urgency, or any other concerns. States he was given a non-narcotics pain meds while at [place], denying pain relief. Was given Rx but has not filled them. Denies tobacco use, or use of illicit substances. Social drinker.       History provided by:  Patient  Language interpreter used: No        Review of Systems:  Review of Systems   Constitutional: Negative for chills, fatigue and fever.   HENT: Negative.    Eyes: Negative for visual disturbance.   Respiratory: Negative for cough and shortness of breath.    Cardiovascular: Negative for chest pain, palpitations and leg swelling.   Gastrointestinal: Positive for nausea. Negative for abdominal pain, blood in stool, constipation, diarrhea and vomiting (resolved).   Genitourinary: Positive for flank pain. Negative for difficulty urinating, dysuria, frequency, hematuria, testicular pain and urgency.   Musculoskeletal: Positive for back pain. Negative for myalgias, neck pain and neck stiffness.   Skin: Negative for color change, rash and wound.   Neurological: Negative for dizziness, syncope, weakness, numbness and headaches. Psychiatric/Behavioral: Negative for confusion.   All other systems reviewed and are negative.      Allergies:  Patient has no known allergies.    Past Medical History:  Medical History:   Diagnosis Date   ??? Hypertension    ??? On supplemental oxygen therapy     CPAP   ??? Thyroid disease        Past Surgical History:  Surgical History:   Procedure Laterality Date   ??? HX TONSILLECTOMY     ??? SHOULDER SURGERY         Pertinent medical/surgical history reviewed  Medical History:   Diagnosis Date   ??? Hypertension    ??? On supplemental oxygen therapy     CPAP   ??? Thyroid disease      Surgical History:   Procedure Laterality Date   ??? HX TONSILLECTOMY     ??? SHOULDER SURGERY         Social History:  Social History     Tobacco Use   ??? Smoking status: Never Smoker   ??? Smokeless tobacco: Never Used   Substance Use Topics   ??? Alcohol use: Yes   ??? Drug use: No     Social History     Substance and Sexual Activity   Drug Use No       Family History:  No family history on file.    Vitals:  ED Vitals    Date and Time T BP P RR SPO2P SPO2 User   02/14/19 2137 -- 166/80 83 16 PER MINUTE -- 97 % MW  02/14/19 1548 36.3 ???C (97.4 ???F) 160/91 78 18 PER MINUTE -- 97 % SR          Physical Exam:  Physical Exam  Vitals signs and nursing note reviewed.   Constitutional:       General: He is awake. He is not in acute distress.     Appearance: He is well-developed. He is not ill-appearing, toxic-appearing or diaphoretic.   HENT:      Head: Normocephalic and atraumatic.      Right Ear: External ear normal.      Left Ear: External ear normal.      Nose: Nose normal.   Eyes:      General: Lids are normal.      Conjunctiva/sclera: Conjunctivae normal.   Neck:      Musculoskeletal: Normal range of motion and neck supple. No neck rigidity.   Cardiovascular:      Rate and Rhythm: Normal rate and regular rhythm.      Pulses: Normal pulses.      Heart sounds: Normal heart sounds.   Pulmonary:      Effort: Pulmonary effort is normal. No respiratory distress. Breath sounds: Normal breath sounds and air entry. No stridor. No decreased breath sounds, wheezing, rhonchi or rales.   Abdominal:      General: There is no distension.      Palpations: Abdomen is soft.      Tenderness: There is no abdominal tenderness. There is no right CVA tenderness, left CVA tenderness, guarding or rebound.      Hernia: No hernia is present.   Musculoskeletal: Normal range of motion.         General: No deformity or signs of injury.      Right lower leg: No edema.      Left lower leg: No edema.   Skin:     General: Skin is warm and dry.      Capillary Refill: Capillary refill takes less than 2 seconds.      Findings: No rash.   Neurological:      General: No focal deficit present.      Mental Status: He is alert and oriented to person, place, and time.   Psychiatric:         Mood and Affect: Mood normal.         Behavior: Behavior normal.         Laboratory Results:  Labs Reviewed   COMPREHENSIVE METABOLIC PANEL - Abnormal       Result Value Ref Range Status    Sodium 137  137 - 147 MMOL/L Final    Potassium 4.2  3.5 - 5.1 MMOL/L Final    Chloride 100  98 - 110 MMOL/L Final    Glucose 173 (*) 70 - 100 MG/DL Final    Blood Urea Nitrogen 17  7 - 25 MG/DL Final    Creatinine 4.54 (*) 0.4 - 1.24 MG/DL Final    Calcium 09.8  8.5 - 10.6 MG/DL Final    Total Protein 8.2 (*) 6.0 - 8.0 G/DL Final    Total Bilirubin 0.8  0.3 - 1.2 MG/DL Final    Albumin 4.8  3.5 - 5.0 G/DL Final    Alk Phosphatase 103  25 - 110 U/L Final    AST (SGOT) 23  7 - 40 U/L Final    CO2 26  21 - 30 MMOL/L Final    ALT (SGPT) 56  7 - 56 U/L Final  Anion Gap 11  3 - 12 Final    eGFR Non African American 35 (*) >60 mL/min Final    eGFR African American 42 (*) >60 mL/min Final   URINALYSIS DIPSTICK REFLEX TO CULTURE - Abnormal    Color,UA YELLOW   Final    Turbidity,UA CLEAR  CLEAR-CLEAR Final    Specific Gravity-Urine 1.023  1.003 - 1.035 Final    pH,UA 6.0  5.0 - 8.0 Final    Protein,UA NEG  NEG-NEG Final Glucose,UA 2+ (*) NEG-NEG Final    Ketones,UA NEG  NEG-NEG Final    Bilirubin,UA NEG  NEG-NEG Final    Blood,UA NEG  NEG-NEG Final    Urobilinogen,UA NORMAL  NORM-NORMAL Final    Nitrite,UA NEG  NEG-NEG Final    Leukocytes,UA NEG  NEG-NEG Final    Urine Ascorbic Acid, UA NEG  NEG-NEG Final   COVID-19 (SARS-COV-2) PCR   CBC AND DIFF    White Blood Cells 8.7  4.5 - 11.0 K/UL Final    RBC 4.91  4.4 - 5.5 M/UL Final    Hemoglobin 15.3  13.5 - 16.5 GM/DL Final    Hematocrit 16.1  40 - 50 % Final    MCV 89.6  80 - 100 FL Final    MCH 31.2  26 - 34 PG Final    MCHC 34.8  32.0 - 36.0 G/DL Final    RDW 09.6  11 - 15 % Final    Platelet Count 231  150 - 400 K/UL Final    MPV 8.5  7 - 11 FL Final    Neutrophils 64  41 - 77 % Final    Lymphocytes 24  24 - 44 % Final    Monocytes 9  4 - 12 % Final    Eosinophils 2  0 - 5 % Final    Basophils 1  0 - 2 % Final    Absolute Neutrophil Count 5.60  1.8 - 7.0 K/UL Final    Absolute Lymph Count 2.06  1.0 - 4.8 K/UL Final    Absolute Monocyte Count 0.79  0 - 0.80 K/UL Final    Absolute Eosinophil Count 0.18  0 - 0.45 K/UL Final    Absolute Basophil Count 0.05  0 - 0.20 K/UL Final   LIPASE    Lipase 13  11 - 82 U/L Final   URINALYSIS MICROSCOPIC REFLEX TO CULTURE    WBCs,UA 0-2  0 - 2 /HPF Final    RBCs,UA 0-2  0 - 3 /HPF Final    Comment,UA     Final    Value: Criteria for reflex to culture are WBC>10, Positive Nitrite, and/or >=+1   leukocytes. If quantity is not sufficient, an addendum will follow.      MucousUA TRACE   Final    Squamous Epithelial Cells 0-2  0 - 5 Final   UA REFLEX CULTURE LABEL   BLUE TOP TUBE   URINE CLEAR TOP TUBE          Radiology Interpretation:    CT ABDOMEN EXTERNAL IMAGING   Final Result          EKG:    None    ED Course:    Pt seen and evaluated in the ED for R flank pain with CT findings of 7.5cm stone mildly obstructing mid right ureterolithiasis   CT imaging sent over cloud.   Labs ordered and reviewed, noted for creatinine level 1.99 and eGFR 35. COVID-19 pending.  LR bolus, Fentanyl, Morphine and Zofran given.  Urology consulted and in room to see pt.  Pt admitted to Urology in stable condition.       MDM  Reviewed: nursing note and vitals  Reviewed previous: CT scan  Interpretation: labs  Consults: Urology        Facility Administered Meds:  Medications   lactated ringers infusion (0 mL Intravenous Infusion Stopped 02/14/19 2137)   fentaNYL citrate PF (SUBLIMAZE) injection 50 mcg (50 mcg Intravenous Given 02/14/19 1816)   ondansetron (ZOFRAN) injection 4 mg (4 mg Intravenous Given 02/14/19 1816)   morphine injection 4 mg (4 mg Intravenous Given 02/14/19 1927)         Clinical Impression:  Clinical Impression   Ureterolithiasis   AKI (acute kidney injury) (HCC)   Elevated blood pressure reading       Disposition/Follow up  ED Disposition     ED Disposition    Admit        No follow-up provider specified.    Medications:  Current Discharge Medication List          Procedure Notes:  Procedures      Attestation / Supervision:  Jeronimo Norma, am scribing for and in the presence of Tyrone Apple, APRN.    Daryel November    Attestation / Supervision Note concerning BRYLAN LYNG: The service was provided by the APP alone with immediate availability of a physician in the ED. and I, Otis Peak, APRN-NP, personally performed the services described in this documentation as scribed in my presence and it is both accurate and complete.    Otis Peak, APRN-NP

## 2019-02-15 ENCOUNTER — Encounter: Admit: 2019-02-15 | Discharge: 2019-02-15

## 2019-02-15 DIAGNOSIS — Z9981 Dependence on supplemental oxygen: Secondary | ICD-10-CM

## 2019-02-15 DIAGNOSIS — I10 Essential (primary) hypertension: Secondary | ICD-10-CM

## 2019-02-15 DIAGNOSIS — N179 Acute kidney failure, unspecified: Secondary | ICD-10-CM

## 2019-02-15 DIAGNOSIS — E079 Disorder of thyroid, unspecified: Secondary | ICD-10-CM

## 2019-02-15 LAB — BASIC METABOLIC PANEL: Lab: 136 MMOL/L — ABNORMAL LOW (ref 137–147)

## 2019-02-15 LAB — COVID-19 (SARS-COV-2) PCR

## 2019-02-15 LAB — CBC: Lab: 9.6 K/UL — ABNORMAL HIGH (ref >60)

## 2019-02-15 MED ORDER — ENOXAPARIN 40 MG/0.4 ML SC SYRG
40 mg | Freq: Every day | SUBCUTANEOUS | 0 refills | Status: DC
Start: 2019-02-15 — End: 2019-02-16
  Administered 2019-02-16: 02:00:00 40 mg via SUBCUTANEOUS

## 2019-02-15 MED ORDER — HYDRALAZINE 20 MG/ML IJ SOLN
10 mg | INTRAVENOUS | 0 refills | Status: DC | PRN
Start: 2019-02-15 — End: 2019-02-16

## 2019-02-15 MED ORDER — HYOSCYAMINE SULFATE 0.125 MG PO TBDI
.125 mg | SUBLINGUAL | 0 refills | Status: DC | PRN
Start: 2019-02-15 — End: 2019-02-16
  Administered 2019-02-15 – 2019-02-16 (×3): 0.125 mg via SUBLINGUAL

## 2019-02-15 MED ORDER — HALOPERIDOL LACTATE 5 MG/ML IJ SOLN
1 mg | Freq: Once | INTRAVENOUS | 0 refills | Status: DC | PRN
Start: 2019-02-15 — End: 2019-02-15

## 2019-02-15 MED ORDER — MIDAZOLAM 1 MG/ML IJ SOLN
INTRAVENOUS | 0 refills | Status: DC
Start: 2019-02-15 — End: 2019-02-15
  Administered 2019-02-15: 13:00:00 2 mg via INTRAVENOUS

## 2019-02-15 MED ORDER — HYDROMORPHONE (PF) 2 MG/ML IJ SYRG
.5 mg | INTRAVENOUS | 0 refills | Status: DC | PRN
Start: 2019-02-15 — End: 2019-02-15

## 2019-02-15 MED ORDER — LACTATED RINGERS IV SOLP
1000 mL | INTRAVENOUS | 0 refills | Status: DC
Start: 2019-02-15 — End: 2019-02-15
  Administered 2019-02-15: 13:00:00 1000 mL via INTRAVENOUS

## 2019-02-15 MED ORDER — LIDOCAINE (PF) 200 MG/10 ML (2 %) IJ SYRG
0 refills | Status: DC
Start: 2019-02-15 — End: 2019-02-15
  Administered 2019-02-15: 13:00:00 80 mg via INTRAVENOUS

## 2019-02-15 MED ORDER — LACTATED RINGERS IV SOLP
INTRAVENOUS | 0 refills | Status: DC
Start: 2019-02-15 — End: 2019-02-15
  Administered 2019-02-15: 14:00:00 1000.000 mL via INTRAVENOUS

## 2019-02-15 MED ORDER — OXYCODONE 5 MG PO TAB
5-10 mg | Freq: Once | ORAL | 0 refills | Status: DC | PRN
Start: 2019-02-15 — End: 2019-02-15

## 2019-02-15 MED ORDER — PROPOFOL INJ 10 MG/ML IV VIAL
0 refills | Status: DC
Start: 2019-02-15 — End: 2019-02-15
  Administered 2019-02-15: 13:00:00 200 mg via INTRAVENOUS

## 2019-02-15 MED ORDER — ONDANSETRON HCL (PF) 4 MG/2 ML IJ SOLN
INTRAVENOUS | 0 refills | Status: DC
Start: 2019-02-15 — End: 2019-02-15
  Administered 2019-02-15: 14:00:00 4 mg via INTRAVENOUS

## 2019-02-15 MED ORDER — FENTANYL CITRATE (PF) 50 MCG/ML IJ SOLN
0 refills | Status: DC
Start: 2019-02-15 — End: 2019-02-15
  Administered 2019-02-15: 13:00:00 50 ug via INTRAVENOUS
  Administered 2019-02-15: 14:00:00 25 ug via INTRAVENOUS

## 2019-02-15 MED ORDER — DIPHENHYDRAMINE HCL 50 MG/ML IJ SOLN
25 mg | Freq: Once | INTRAVENOUS | 0 refills | Status: DC | PRN
Start: 2019-02-15 — End: 2019-02-15

## 2019-02-15 MED ORDER — FENTANYL CITRATE (PF) 50 MCG/ML IJ SOLN
50 ug | INTRAVENOUS | 0 refills | Status: DC | PRN
Start: 2019-02-15 — End: 2019-02-15

## 2019-02-15 MED ORDER — CEFAZOLIN 1 GRAM IJ SOLR
0 refills | Status: DC
Start: 2019-02-15 — End: 2019-02-15
  Administered 2019-02-15: 14:00:00 2 g via INTRAVENOUS

## 2019-02-15 MED ORDER — DEXTRAN 70-HYPROMELLOSE (PF) 0.1-0.3 % OP DPET
0 refills | Status: DC
Start: 2019-02-15 — End: 2019-02-15
  Administered 2019-02-15: 13:00:00 2 [drp] via OPHTHALMIC

## 2019-02-15 MED ORDER — DEXAMETHASONE SODIUM PHOSPHATE 4 MG/ML IJ SOLN
INTRAVENOUS | 0 refills | Status: DC
Start: 2019-02-15 — End: 2019-02-15
  Administered 2019-02-15: 14:00:00 4 mg via INTRAVENOUS

## 2019-02-15 MED ORDER — PROMETHAZINE 25 MG/ML IJ SOLN
6.25 mg | INTRAVENOUS | 0 refills | Status: DC | PRN
Start: 2019-02-15 — End: 2019-02-15

## 2019-02-15 MED ORDER — HYDRALAZINE 20 MG/ML IJ SOLN
10 mg | INTRAVENOUS | 0 refills | Status: DC | PRN
Start: 2019-02-15 — End: 2019-02-15

## 2019-02-15 MED ORDER — LIDOCAINE HCL 2 % MM JELP
0 refills | Status: DC
Start: 2019-02-15 — End: 2019-02-15
  Administered 2019-02-15: 14:00:00 20 mL via TOPICAL

## 2019-02-15 MED ORDER — LEVOFLOXACIN IN D5W 500 MG/100 ML IV PGBK
500 mg | INTRAVENOUS | 0 refills | Status: DC
Start: 2019-02-15 — End: 2019-02-16
  Administered 2019-02-15 – 2019-02-16 (×2): 500 mg via INTRAVENOUS

## 2019-02-15 NOTE — Progress Notes
RT Adult Assessment Note    NAME:Grant Olson             MRN: [id number]             DOB:09/17/1960          AGE: 58 y.o.  ADMISSION DATE: 02/14/2019             DAYS ADMITTED: LOS: 1 day    RT Treatment Plan:            Additional Comments:  Impressions of the patient: NAD  Intervention(s)/outcome(s): none  Patient education that was completed: none  Recommendations to the care team:none    Vital Signs:  Pulse:    RR:    SpO2:    O2 Device:    Liter Flow:    O2%:    Breath Sounds:    Respiratory Effort:

## 2019-02-15 NOTE — Progress Notes
Patient arrived to room # [phone] via wheelchair accompanied by transport. Patient transferred to the bed without assistance. Bedside safety checks completed. Initial patient assessment completed. Refer to flowsheet for details.    Admission skin assessment completed with: Tommie Ard, RN     Pressure injury present on arrival?: No    1. Head/Face/Neck: No  2. Trunk/Back: No  3. Upper Extremities: No  4. Lower Extremities: No  5. Pelvic/Coccyx: No  6. Assessed for device associated injury? Yes  7. Malnutrition Screening Tool (Nursing Nutrition Assessment) Completed? Yes    See Doc Flowsheet for additional wound details.     INTERVENTIONS:

## 2019-02-15 NOTE — Anesthesia Post-Procedure Evaluation
Post-Anesthesia Evaluation    Name: Grant Olson      MRN: [id number]     DOB: 05-22-1961     Age: 58 y.o.     Sex: male   __________________________________________________________________________     Procedure Information     Anesthesia Start Date/Time:  02/15/19 0815    Procedure:  CYSTOURETHROSCOPY WITH URETEROSCOPY AND/ OR PYELOSCOPY - WITh INSERTION URETERAL STENT (Bilateral Ureter)    Location:  MAIN OR 57 / Main OR/Periop    Surgeon:  Jasper Riling, MD          Post-Anesthesia Vitals  BP: 161/92 (08/01 1015)  Pulse: 78 (08/01 1015)  Respirations: 20 PER MINUTE (08/01 1015)  SpO2: 94 % (08/01 1015)  SpO2 Pulse: 78 (08/01 1015)   Vitals Value Taken Time   BP 161/92 02/15/2019 10:15 AM   Temp 36.4 C (97.6 F) 02/15/2019  9:10 AM   Pulse 78 02/15/2019 10:15 AM   Respirations 20 PER MINUTE 02/15/2019 10:15 AM   SpO2 94 % 02/15/2019 10:15 AM         Post Anesthesia Evaluation Note    Evaluation location: Pre/Post  Patient participation: recovered; patient participated in evaluation  Level of consciousness: alert    Pain score: 2  Pain management: adequate    Hydration: normovolemia  Temperature: 36.0C - 38.4C  Airway patency: adequate    Perioperative Events       Post-op nausea and vomiting: no PONV    Postoperative Status  Cardiovascular status: hemodynamically stable and hypertensive (HTN consistent with pre-op)  Respiratory status: spontaneous ventilation  Follow-up needed: none        Perioperative Events  Perioperative Event: No  Emergency Case Activation: No

## 2019-02-15 NOTE — Anesthesia Pre-Procedure Evaluation
Anesthesia Pre-Procedure Evaluation    Name: Grant Olson      MRN: [id number]     DOB: [birth year]     Age: 58 y.o.     Sex: male   _________________________________________________________________________     Procedure Info:   Procedure Information     Date/Time:  02/15/19 0800    Procedure:  CYSTOURETHROSCOPY WITH URETEROSCOPY AND/ OR PYELOSCOPY - WITH LITHOTRIPSY INCLUDING INSERTION URETERAL STENT (Right Ureter)    Location:  MAIN OR 57 / Main OR/Periop    Surgeon:  Pennie Banter, MD          Physical Assessment  Vital Signs (last filed in past 24 hours):  BP: 155/85 (08/01 0750)  Temp: 36.4 ???C (97.6 ???F) (08/01 0750)  Pulse: 78 (08/01 0750)  Respirations: 16 PER MINUTE (08/01 0750)  SpO2: 97 % (08/01 0750)  Height: 180.3 cm (71) (07/31 1548)  Weight: 113.4 kg (250 lb) (07/31 1548)      Patient History   No Known Allergies     Current Medications    Medication Directions   atorvastatin (LIPITOR) 10 mg tablet Take 10 mg by mouth daily.   cefdinir (OMNICEF) 300 mg capsule Take 300 mg by mouth every 12 hours.   levothyroxine (SYNTHROID) 50 mcg tablet Take 50 mcg by mouth daily 30 minutes before breakfast.         Review of Systems/Medical History      Patient summary reviewed  Nursing notes reviewed  Pertinent labs reviewed    PONV Screening: Non-smoker  No history of anesthetic complications    Family history of anesthetic complications (Mother): atypical pseudocholinesterase      Airway         Prior intubation - Grade II view with Mac 3      Pulmonary           Not on home oxygen        Sleep apnea          Interventions: CPAP; compliant      Cardiovascular         Exercise tolerance: >4 METS        Hypertension, well controlled      Hyperlipidemia      GI/Hepatic/Renal         Right ureteral calculus      Neuro/Psych - negative        Musculoskeletal - negative        Endocrine/Other         Hypothyroidism    Constitution - negative   Physical Exam    Airway Findings      Mallampati: II TM distance: >3 FB      Neck ROM: full      Mouth opening: good      Airway patency: adequate    Dental Findings: Negative      Cardiovascular Findings: Negative      Rhythm: regular      Rate: normal    Pulmonary Findings: Negative      Breath sounds clear to auscultation.    Abdominal Findings:       Obese    Neurological Findings:       Alert and oriented x 3       Diagnostic Tests  Hematology:   Lab Results   Component Value Date    HGB 14.2 02/15/2019    HCT 40.2 02/15/2019    PLTCT 195 02/15/2019    WBC 9.6 02/15/2019    NEUT  64 02/14/2019    ANC 5.60 02/14/2019    ALC 2.06 02/14/2019    MONA 9 02/14/2019    AMC 0.79 02/14/2019    EOSA 2 02/14/2019    ABC 0.05 02/14/2019    MCV 89.2 02/15/2019    MCH 31.4 02/15/2019    MCHC 35.2 02/15/2019    MPV 8.4 02/15/2019    RDW 13.3 02/15/2019         General Chemistry:   Lab Results   Component Value Date    NA 136 02/15/2019    K 4.1 02/15/2019    CL 101 02/15/2019    CO2 25 02/15/2019    GAP 10 02/15/2019    BUN 17 02/15/2019    CR 1.92 02/15/2019    GLU 193 02/15/2019    CA 9.5 02/15/2019    ALBUMIN 4.8 02/14/2019    TOTBILI 0.8 02/14/2019      Coagulation: No results found for: PT, PTT, INR      Anesthesia Plan    ASA score: 3   Plan: general  Induction method: intravenous  NPO status: acceptable      Informed Consent  Anesthetic plan and risks discussed with patient.        Plan discussed with: anesthesiologist and CRNA.

## 2019-02-15 NOTE — ED Notes
Report given to [facility] on CA[facility]

## 2019-02-16 ENCOUNTER — Encounter: Admit: 2019-02-16 | Discharge: 2019-02-16

## 2019-02-16 ENCOUNTER — Encounter: Admit: 2019-02-15 | Discharge: 2019-02-15

## 2019-02-16 ENCOUNTER — Ambulatory Visit: Admit: 2019-02-14 | Discharge: 2019-02-16 | Disposition: A | Discharge status: Home / Self Care

## 2019-02-16 DIAGNOSIS — E039 Hypothyroidism, unspecified: Secondary | ICD-10-CM

## 2019-02-16 DIAGNOSIS — Z1159 Encounter for screening for other viral diseases: Secondary | ICD-10-CM

## 2019-02-16 DIAGNOSIS — N202 Calculus of kidney with calculus of ureter: Secondary | ICD-10-CM

## 2019-02-16 DIAGNOSIS — I10 Essential (primary) hypertension: Secondary | ICD-10-CM

## 2019-02-16 DIAGNOSIS — N200 Calculus of kidney: Secondary | ICD-10-CM

## 2019-02-16 LAB — CULTURE-URINE W/SENSITIVITY

## 2019-02-16 LAB — CBC: Lab: 7.9 K/UL — ABNORMAL LOW (ref 4.5–11.0)

## 2019-02-16 LAB — BASIC METABOLIC PANEL: Lab: 135 MMOL/L — ABNORMAL LOW (ref 137–147)

## 2019-02-16 MED ORDER — TAMSULOSIN 0.4 MG PO CAP
0.4 mg | ORAL_CAPSULE | Freq: Every day | ORAL | 1 refills | 90.00000 days | Status: DC
Start: 2019-02-16 — End: 2019-03-04

## 2019-02-16 MED ORDER — HYOSCYAMINE SULFATE 0.125 MG PO TBDI
.125 mg | ORAL_TABLET | SUBLINGUAL | 1 refills | 5.00000 days | Status: DC | PRN
Start: 2019-02-16 — End: 2019-03-04

## 2019-02-16 MED ORDER — POLYETHYLENE GLYCOL 3350 17 GRAM PO PWPK
17 g | Freq: Every day | ORAL | 1 refills | 18.00000 days | Status: DC
Start: 2019-02-16 — End: 2019-04-02

## 2019-02-16 MED ORDER — LEVOFLOXACIN 500 MG PO TAB
500 mg | ORAL_TABLET | Freq: Every day | ORAL | 0 refills | 7.00000 days | Status: AC
Start: 2019-02-16 — End: ?

## 2019-02-16 MED ORDER — ACETAMINOPHEN 325 MG PO TAB
650 mg | ORAL | 0 refills | Status: DC | PRN
Start: 2019-02-16 — End: 2019-04-02

## 2019-02-16 MED ORDER — OXYCODONE 5 MG PO TAB
5-10 mg | ORAL_TABLET | ORAL | 0 refills | 6.00000 days | Status: DC | PRN
Start: 2019-02-16 — End: 2019-05-07

## 2019-02-16 MED ORDER — CEFAZOLIN INJ 1GM IVP
2 g | Freq: Once | INTRAVENOUS | 0 refills | Status: CN
Start: 2019-02-16 — End: ?

## 2019-02-16 NOTE — Progress Notes
Discharge instructions printed and provided to pt. Discharge instructions reviewed with pt including activity, diet, incision care, s/s to report, s/s of infection, medications, and pain management. Pt verbalizes understanding. Hospital contact information provided to pt.

## 2019-02-16 NOTE — Care Plan
Problem: Discharge Planning  Goal: Participation in plan of care  Outcome: Goal Achieved  Goal: Knowledge regarding plan of care  Outcome: Goal Achieved  Goal: Prepared for discharge  Outcome: Goal Achieved

## 2019-02-16 NOTE — Discharge Instructions - Appointments
You will receive a call from our office to schedule surgery to remove your kidney stones. Your upcoming appointment with Dr. Belva Agee will be cancelled as Dr. Illene Silver has assumed your care.

## 2019-02-17 ENCOUNTER — Encounter: Admit: 2019-02-17 | Discharge: 2019-02-17

## 2019-02-17 DIAGNOSIS — Z9981 Dependence on supplemental oxygen: Secondary | ICD-10-CM

## 2019-02-17 DIAGNOSIS — N200 Calculus of kidney: Secondary | ICD-10-CM

## 2019-02-17 DIAGNOSIS — N202 Calculus of kidney with calculus of ureter: Secondary | ICD-10-CM

## 2019-02-17 DIAGNOSIS — E079 Disorder of thyroid, unspecified: Secondary | ICD-10-CM

## 2019-02-17 DIAGNOSIS — I10 Essential (primary) hypertension: Secondary | ICD-10-CM

## 2019-02-17 NOTE — Telephone Encounter
Called [facility] with call back number to set this up with patient .

## 2019-02-17 NOTE — Telephone Encounter
-----   Message from Salem. Igel, MD sent at 02/16/2019  8:50 AM CDT -----  Regarding: Procedure scheduling for patient  Dear Lonn Georgia,    Mr. Minus is a gentleman with bilateral kidney stones that we stented this weekend. Please arrange for Mr. Radel to return in approximately 2 weeks for bilateral ureteroscopy (prep for case is in). The patient would prefer a Friday if possible.    Thanks so much,    Daniel A. Assunta Gambles, MD

## 2019-02-17 NOTE — Discharge Instructions - Pharmacy
Physician Discharge Summary      Name: Grant Olson  Medical Record Number: [id number]        Account Number:  [id number]  Date Of Birth:  Feb 22, 1961                         Age:  58 years   Admit date:  02/14/2019                     Discharge date:  02/16/19    Attending Physician:  Dr. Pennie Banter               Service: Surgery-Urology    Physician Summary completed by: Clarisse Gouge. , MD     Reason for hospitalization: Nephrolithiasis and acute kidney injury    Significant PMH:   Medical History:   Diagnosis Date   ??? Hypertension    ??? On supplemental oxygen therapy     CPAP   ??? Thyroid disease           Allergies: Patient has no known allergies.    Admission Physical Exam notable for:    General: Alert, oriented, cooperative, no distress  Head: Normocephalic, without obvious abnormality, atraumatic  Eyes: EOMI, no scleral icterus  Lungs: Unlabored on RA  Heart: RRR, no m/g/r  Abdomen: Soft, non-tender, non-distended. Normoactive bowel sounds  GU: Voiding spontaneously  Extremity: No clubbing, cyanosis, or edema  Neurologic: Grossly intact.   ???      Admission Lab/Radiology studies notable for:   FLUORO MOBILE IN OR   Final Result      CT ABDOMEN EXTERNAL IMAGING   Final Result           Brief Hospital Course:  The patient was admitted and the following issues were addressed during this hospitalization: (with pertinent details).  Patient presented to the hospital with a large, obstructing right mid ureteral stone as well as bilateral nonobstructive nephrolithiasis in the kidney.  He also had an AKI with a creatinine of 2.  He was admitted 02-14-2019 and was stented the following morning, and had drainage of debris that appeared possibly purulent from both kidneys.  Therefore, we observe the patient overnight on IV antibiotics.  He was also kept in house to make sure that his creatinine improved given his significant AKI.  He was discharged when his pain is well controlled, his AKI improved, he was having no infectious symptoms, and he was tolerating a diet.      Condition at Discharge: Stable    Discharge Diagnoses:       Hospital Problems        Active Problems    * (Principal) Acute kidney injury [facility])          Surgical Procedures: Cystoscopy and bilateral ureteral stent placement    Significant Diagnostic Studies and Procedures: none    Consults:  None    Patient Disposition: Home       Patient instructions/medications:       Regular Diet    You have no dietary restriction. Please continue with a healthy balanced diet.     Report These Signs and Symptoms    Please contact your doctor if you have any of the following symptoms: temperature higher than 100.4 degrees F, uncontrolled pain, persistent nausea and/or vomiting, difficulty breathing, chest pain, severe abdominal pain, unable to urinate, unable to have bowel movement or drainage with a foul odor  Questions About Your Stay    You may have questions about your hospital stay after you get home.    From 8 AM to 4 PM Monday through Friday, please call [phone].     If calling after hours with urgent questions that cannot wait until the next day, please call [phone] and ask for the urology resident on call. In case of an emergency, please report to your nearest emergency department and contact us on the way.     Discharging attending physician: Pennie Banter [[id number]]      Activity as Tolerated    It is important to keep increasing your activity level after you leave the hospital.  Moving around can help prevent blood clots, lung infection (pneumonia) and other problems.  Gradually increasing the number of times you are up moving around will help you return to your normal activity level more quickly.  Continue to increase the number of times you are up to the chair and walking daily to return to your normal activity level. Begin to work toward your normal activity level at discharge     Driving Restrictions No driving while taking pain medication.     Opioid (Narcotic) Safety Information    OPIOID (NARCOTIC) PAIN MEDICATION SAFETY    We care about your comfort, and believe you need opioid medications at this time to treat your pain.  An opioid is a strong pain medication.  It is only available by prescription for moderate to severe pain.  Usually these medications are used for only a short time to treat pain, but sometimes will be prescribed for longer.  Talk with your doctor or nurse about how long they expect you to need this medication.    When used the right way, opioids are safe and effective medications to treat your pain, even when used for a long time.  Yet, when used in the wrong way, opioids can be dangerous for you or others.  Opioids do not work for everyone.  Most patients do not get full relief of their pain from opioid medication; full relief of your pain may not be possible.     For your safety, we ask you to follow these instructions:    *Only take your opioid medication as prescribed.  If your pain is not controlled with the prescribed dose, or the medication is not lasting long enough, call your doctor.  *Do not break or crush your opioid medication unless your doctor or pharmacist says you can.  With certain medications, this can be dangerous, and may cause death.  *Never share your medications with others, even if they appear to have a good reason.  Never take someone else's pain medication-this is dangerous, and illegal (a crime).  Overdoses and deaths have occurred.  *Keep your opioid medications safe, as you would with cash, in a lock box or similar container.  *Make sure your opioids are going to be secure, especially if you are around children or teens.  *Talk with your doctor or pharmacist before you take other medications.  *Avoid driving, operating machinery, or drinking alcohol while taking opioid pain medication.  This may be unsafe. Pain medications can cause constipation. Constipation is bowel movements that are less often than normal. Stools often become very hard and difficult to pass. This may lead to stomach pain and bloating. It may also cause pain when trying to use the bathroom. Constipation may be treated with suppositories, laxatives or stool softeners. A diet high in fiber with  plenty of fluids helps to maintain regular, soft bowel movements.     Additional Discharge Instructions    Ureteral Stent Home Care     You have had a ureteral stent placed. The stent is a slender catheter (tube) placed inside your  ureter so urine can drain from your kidney to your bladder.  The stent had a curl at each end.  One curl is in the kidney and the other is in the bladder.      Reasons for Placement   A stent may be used if you had a(n):  ??? Stone removed through the ureter (the tube that carries urine from the kidney to the bladder).  The stent drains urine until swelling in the ureter goes away.  ??? A narrow passage in the ureter (stricture): A stent opens the narrow spot in the ureter.  ??? Injury: The stent drains urine so the injured part can heal.  ??? Surgery on your kidney, bladder, ureter.    Symptoms You May Have  ??? Discomfort or pain in the bladder or flank (side)  ??? Bloody urine  ??? Urinate (pee) often, frequent urge to urinate, burning when you urinate  ??? This symptoms are normal and common with a stent in place.  You have been provided with medication to help relieve these symptoms. Examples include Levsin (hyoscyamine), Oxybutynin, and Flomax (tamsulosin).  NSAIDs such as ibuprofen can also be helpful, but do not take these if you have been instructed to avoid such medications.      When you go home you should:  ??? Take antibiotics (medication for infections) for the length of time your doctor prescribed. Do not  stop taking, even if you feel better.  ??? Take pain medication as prescribed if needed. ??? While taking pain medicine, do not drive or do things that require you to be alert.  ??? Drink plenty of fluids unless your doctor has told you to limit how much you drink.  ??? Eat your normal diet.  ??? Do your normal routines when your doctor tells you.  ??? Keep your doctor's appointments so your stent can be checked, removed, or replaced.    Special Guidelines   ???If you have nylon strings coming out of your urinary meatus (opening where urine exits the body), do not remove them unless your doctor tells you to do so. These strings are used to remove your stent.  ???You may be asked to secure the strings with tape. If so, you will be shown how to do this.    Call Your Doctor if You Have:  ??? Fever over 101.5 degrees F or higher  ??? Blood clots in your urine  ??? Trouble passing your urine  ??? Severe pain in your abdomen or flank that does not improve after taking prescribed medications.      Current Discharge Medication List       START taking these medications    Details   acetaminophen (TYLENOL) 325 mg tablet Take two tablets by mouth every 4 hours as needed.    PRESCRIPTION TYPE:  OTC      hyoscyamine (ANASPAZ) 0.125 mg rapid dissolve tablet Place one tablet under tongue every 4 hours as needed.  Qty: 30 tablet, Refills: 1    PRESCRIPTION TYPE:  Normal      levoFLOXacin (LEVAQUIN) 500 mg tablet Take one tablet by mouth daily for 4 days.  Qty: 4 tablet, Refills: 0    PRESCRIPTION TYPE:  Normal  oxyCODONE (ROXICODONE) 5 mg tablet Take one tablet to two tablets by mouth every 4 hours as needed  Qty: 10 tablet, Refills: 0    PRESCRIPTION TYPE:  Normal      polyethylene glycol 3350 (MIRALAX) 17 g packet Take one packet by mouth daily.  Qty: 24 each, Refills: 1    PRESCRIPTION TYPE:  Normal      tamsulosin (FLOMAX) 0.4 mg capsule Take one capsule by mouth daily after breakfast. Do not crush, chew or open capsules. Take 30 minutes following the same meal each day. Take until 3 days after stents are removed to help with stent pain.  Qty: 30 capsule, Refills: 1    PRESCRIPTION TYPE:  Normal          CONTINUE these medications which have NOT CHANGED    Details   levothyroxine (SYNTHROID) 50 mcg tablet Take 50 mcg by mouth daily 30 minutes before breakfast.    PRESCRIPTION TYPE:  Historical Med      losartan (COZAAR) 25 mg tablet Take 25 mg by mouth daily.    PRESCRIPTION TYPE:  Historical Med      metFORMIN (GLUCOPHAGE) 500 mg tablet Take 500 mg by mouth twice daily with meals.    PRESCRIPTION TYPE:  Historical Med           Scheduled appointments:    Mar 01, 2019 10:50 AM CDT  Pre Proc Screening Nurse Visit with [facility] SCREENING NURSE  [facility]) [address]  [place] [address]    Additional appointment instructions:      You will receive a call from our office to schedule surgery to remove your kidney stones. Your upcoming appointment with Dr. Lissa Morales will be cancelled as Dr. Littie Deeds has assumed your care.                  Pending items needing follow up: Definitive stone treatment and stent removal (has been arranged)    Signed:  Clarisse Gouge. , MD  02/17/2019      cc:  Primary Care Physician:  Lavone Orn   Verified  Referring physicians:  No ref. provider found   Additional provider(s):

## 2019-02-18 NOTE — Pre-Anesthesia Patient Instructions
GENERAL INFORMATION    Before you come to the hospital  ??? Make arrangements for a responsible adult to drive you home and stay with you for 24 hours following surgery.  ??? Bath/Shower Instructions  ??? Take a bath or shower with antibacterial soap the night before or the morning of your procedure. Use clean towels.  ??? Put on clean clothes after bath or shower.  Avoid using lotion and oils.  ??? Sleep on clean sheets if bath or shower is done the night before procedure.  ??? Leave money, credit cards, jewelry, and any other valuables at home. [facility] is not responsible for the loss or breakage of personal items.  ??? Remove nail polish, makeup and all jewelry (including piercings) before coming to the hospital.  ??? The morning of your procedure:  ??? brush your teeth and tongue  ??? do not smoke  ??? do not shave the area where you will have surgery    What to bring to the hospital  ??? ID/ Insurance Card  ??? Medical Device card  ??? Official documents for legal guardianship   ??? Copy of your Living Will, Advanced Directives, and/or Durable Power of Attorney   ??? Small bag with a few personal belongings  ??? Cases for glasses/hearing aids/contact lens (bring solutions for contacts)  ??? Dress in clean, loose, comfortable clothing     Eating or drinking before surgery  ??? Do not eat or drink anything after 11:00 p.m. the day before your procedure (including gum, mints, candy, or chewing tobacco) OR follow the specific instructions you were given by your Surgeon.  ??? You may have WATER ONLY up to 2 hours before arriving at the hospital.     Other instructions  Notify your surgeon if:  ??? you become ill with a cough, fever, sore throat, nausea, vomiting or flu-like symptoms  ??? you have any open wounds/sores that are red, painful, draining, or are new since you last saw  the doctor  ??? you need to cancel your procedure  ??? You will receive a call with your surgery arrival time from between 2:30pm and 4:30pm the last business day before your procedure.  If you do not receive a call, please call [phone] before 4:30pm or [phone] after 4:30pm.    Notify us at [facility]: [phone]  ??? if you need to cancel your procedure  ??? if you are going to be late    Arrival at the hospital    [facility]  [address]  [place], [place] [address]    ??? Park in the [employer], located directly across from the main entrance to the hospital.  ??? Valet parking is available  from 7 AM to 4 PM Monday through Friday.  ??? Enter through the ground floor main hospital entrance and check in at the Information Desk in the lobby.  ??? They will validate your parking ticket and direct you to the next location.  ??? If you are a woman between the ages of 50 and 17, and have not had a hysterectomy, you will be asked for a urine sample prior to surgery.  Please do not urinate before arriving in the Surgery Waiting Room.  Once there, check in and let the attendant know if you need to provide a sample.    Coronavirus (COVID19) Information  If you get sick with fever (100.73F/38C or higher), cough, or have trouble breathing:  ? Call your  primary care physician for questions or health needs  ? Tell your doctor about any recent travel and your symptoms.  ? Avoid contact with others.  ? Notify Designer, industrial/product.  For up to date information on the Coronavirus, visit the CDC website at DiningCalendar.de.

## 2019-02-18 NOTE — Pre-Anesthesia Medication Instructions
YOUR MEDICATIONS:     acetaminophen (TYLENOL) 325 mg tablet Take two tablets by mouth every 4 hours as needed.    hyoscyamine (ANASPAZ) 0.125 mg rapid dissolve tablet Place one tablet under tongue every 4 hours as needed.    levoFLOXacin (LEVAQUIN) 500 mg tablet Take one tablet by mouth daily for 4 days.    levothyroxine (SYNTHROID) 50 mcg tablet Take 50 mcg by mouth daily 30 minutes before breakfast.    losartan (COZAAR) 25 mg tablet Take 25 mg by mouth daily.    metFORMIN (GLUCOPHAGE) 500 mg tablet Take 500 mg by mouth twice daily with meals.    oxyCODONE (ROXICODONE) 5 mg tablet Take one tablet to two tablets by mouth every 4 hours as needed    polyethylene glycol 3350 (MIRALAX) 17 g packet Take one packet by mouth daily.    tamsulosin (FLOMAX) 0.4 mg capsule Take one capsule by mouth daily after breakfast. Do not crush, chew or open capsules. Take 30 minutes following the same meal each day. Take until 3 days after stents are removed to help with stent pain.          YOUR  MEDICATION INSTRUCTIONS FOR SURGERY:    Before surgery  Do not start any new vitamins, herbals, and natural supplements 14 days before surgery:    Stop the following medications 7 days before surgery:   Anti-inflammatory medications such as ibuprofen (Advil, Motrin) and naproxen (Aleve)   You may use acetaminophen (Tylenol)       Morning of surgery  On the morning of surgery, do NOT take these medications:   Remaining vitamins/supplements   Ointments/creams/lotions   Losartan   Metformin    On the morning of surgery, take ONLY these medications with a sip (1-2 ounces) of water:   Levothyroxine   Tamsulosin    Other information  Before surgery, please contact Suanne Marker, RN with any medicine updates or questions.   E-mail:  [email or web address]   Phone:  [phone]    Before going home from the hospital, please ask your doctor when you should re-start your medicines that were stopped before surgery.

## 2019-02-18 NOTE — Progress Notes
[facility] phone triage completed with patient for surgery on 03/04/19 with Dr. Illene Silver. Medications, allergies and medical history reviewed and updated in chart. Just had surgery on 02/15/19 - tolerated well and denies changes to medical or functional status.   Labs on 02/16/19 WNL for this surgery. Denies CP, DOE or URI symptoms at this time. No PAC visit indicated.    Preop and medication instructions reviewed with patient. No vitamins or supplements for 14 days and no NSAIDS for 7 days before surgery. Visitor policy reviewed. Will hold Losartan and  Metformin on DOS. NPO after 11 pm the night before surgery but ok to drink water until 2 hours before arrival at hospital. Patient verbalized understanding and copy of instructions emailed to him

## 2019-03-01 ENCOUNTER — Encounter: Admit: 2019-03-01 | Discharge: 2019-03-02

## 2019-03-01 NOTE — Progress Notes
Patient arrived to [place] clinic for COVID-19 testing 03/01/19 1036. Patient identity confirmed via photo I.D. Nasopharyngeal procedure explained to the patient.   Nasopharyngeal swab completed left  Patient education provided given and instructed patient self isolate until contacted w/ results and further instructions. CDC handout on COVID-19 given to patient.   [email or web address]    Swab collected by Salley Scarlet.    Date symptoms began/reason for testing: preop 03/04/19

## 2019-03-02 ENCOUNTER — Encounter: Admit: 2019-03-02 | Discharge: 2019-03-02

## 2019-03-02 DIAGNOSIS — Z1159 Encounter for screening for other viral diseases: Principal | ICD-10-CM

## 2019-03-02 LAB — COVID-19 (SARS-COV-2) PCR

## 2019-03-03 DIAGNOSIS — N202 Calculus of kidney with calculus of ureter: Principal | ICD-10-CM

## 2019-03-04 ENCOUNTER — Ambulatory Visit: Admit: 2019-03-04 | Discharge: 2019-03-04 | Payer: BC Managed Care – HMO

## 2019-03-04 ENCOUNTER — Encounter: Admit: 2019-03-04 | Discharge: 2019-03-04 | Payer: BC Managed Care – HMO

## 2019-03-04 ENCOUNTER — Ambulatory Visit: Admit: 2019-03-04 | Discharge: 2019-03-04

## 2019-03-04 ENCOUNTER — Ambulatory Visit: Admit: 2019-03-03 | Discharge: 2019-03-03 | Payer: BC Managed Care – HMO

## 2019-03-04 DIAGNOSIS — Z87442 Personal history of urinary calculi: Secondary | ICD-10-CM

## 2019-03-04 DIAGNOSIS — Z9981 Dependence on supplemental oxygen: Secondary | ICD-10-CM

## 2019-03-04 DIAGNOSIS — I10 Essential (primary) hypertension: Secondary | ICD-10-CM

## 2019-03-04 DIAGNOSIS — E079 Disorder of thyroid, unspecified: Secondary | ICD-10-CM

## 2019-03-04 DIAGNOSIS — Z7984 Long term (current) use of oral hypoglycemic drugs: Secondary | ICD-10-CM

## 2019-03-04 DIAGNOSIS — N202 Calculus of kidney with calculus of ureter: Secondary | ICD-10-CM

## 2019-03-04 LAB — POC GLUCOSE: Lab: 161 mg/dL — ABNORMAL HIGH (ref 70–100)

## 2019-03-04 MED ORDER — OXYCODONE 5 MG PO TAB
5-10 mg | Freq: Once | ORAL | 0 refills | Status: DC | PRN
Start: 2019-03-04 — End: 2019-03-04

## 2019-03-04 MED ORDER — PROMETHAZINE 25 MG/ML IJ SOLN
6.25 mg | INTRAVENOUS | 0 refills | Status: DC | PRN
Start: 2019-03-04 — End: 2019-03-04

## 2019-03-04 MED ORDER — MIDAZOLAM 1 MG/ML IJ SOLN
INTRAVENOUS | 0 refills | Status: DC
Start: 2019-03-04 — End: 2019-03-04
  Administered 2019-03-04: 15:00:00 2 mg via INTRAVENOUS

## 2019-03-04 MED ORDER — DIPHENHYDRAMINE HCL 50 MG/ML IJ SOLN
25 mg | Freq: Once | INTRAVENOUS | 0 refills | Status: DC | PRN
Start: 2019-03-04 — End: 2019-03-04

## 2019-03-04 MED ORDER — TAMSULOSIN 0.4 MG PO CAP
0.4 mg | ORAL_CAPSULE | Freq: Every day | ORAL | 1 refills | 90.00000 days | Status: DC
Start: 2019-03-04 — End: 2019-04-02

## 2019-03-04 MED ORDER — ROCURONIUM 10 MG/ML IV SOLN
INTRAVENOUS | 0 refills | Status: DC
Start: 2019-03-04 — End: 2019-03-04
  Administered 2019-03-04 (×3): 10 mg via INTRAVENOUS
  Administered 2019-03-04: 15:00:00 50 mg via INTRAVENOUS
  Administered 2019-03-04 (×2): 10 mg via INTRAVENOUS

## 2019-03-04 MED ORDER — FENTANYL CITRATE (PF) 50 MCG/ML IJ SOLN
0 refills | Status: DC
Start: 2019-03-04 — End: 2019-03-04
  Administered 2019-03-04 (×2): 50 ug via INTRAVENOUS
  Administered 2019-03-04: 15:00:00 100 ug via INTRAVENOUS

## 2019-03-04 MED ORDER — PHENYLEPHRINE IN 0.9% NACL(PF) 1 MG/10 ML (100 MCG/ML) IV SYRG
INTRAVENOUS | 0 refills | Status: DC
Start: 2019-03-04 — End: 2019-03-04
  Administered 2019-03-04 (×2): 100 ug via INTRAVENOUS

## 2019-03-04 MED ORDER — OXYBUTYNIN CHLORIDE 5 MG PO TAB
5 mg | ORAL_TABLET | Freq: Three times a day (TID) | ORAL | 0 refills | 12.00000 days | Status: DC
Start: 2019-03-04 — End: 2019-04-02

## 2019-03-04 MED ORDER — HALOPERIDOL LACTATE 5 MG/ML IJ SOLN
1 mg | Freq: Once | INTRAVENOUS | 0 refills | Status: DC | PRN
Start: 2019-03-04 — End: 2019-03-04

## 2019-03-04 MED ORDER — TRAMADOL 50 MG PO TAB
50 mg | ORAL_TABLET | ORAL | 0 refills | Status: DC | PRN
Start: 2019-03-04 — End: 2019-04-02

## 2019-03-04 MED ORDER — PROPOFOL INJ 10 MG/ML IV VIAL
0 refills | Status: DC
Start: 2019-03-04 — End: 2019-03-04
  Administered 2019-03-04: 15:00:00 150 mg via INTRAVENOUS
  Administered 2019-03-04: 18:00:00 50 mg via INTRAVENOUS

## 2019-03-04 MED ORDER — DEXAMETHASONE SODIUM PHOSPHATE 4 MG/ML IJ SOLN
INTRAVENOUS | 0 refills | Status: DC
Start: 2019-03-04 — End: 2019-03-04

## 2019-03-04 MED ORDER — SODIUM CHLORIDE 0.9 % IV SOLP
1000 mL | INTRAVENOUS | 0 refills | Status: DC
Start: 2019-03-04 — End: 2019-03-04

## 2019-03-04 MED ORDER — HYOSCYAMINE SULFATE 0.125 MG PO TBDI
.125 mg | ORAL_TABLET | SUBLINGUAL | 1 refills | Status: DC | PRN
Start: 2019-03-04 — End: 2019-04-02

## 2019-03-04 MED ORDER — LIDOCAINE HCL 2 % MM JELP
0 refills | Status: DC
Start: 2019-03-04 — End: 2019-03-04
  Administered 2019-03-04: 15:00:00 20 mL via TOPICAL

## 2019-03-04 MED ORDER — FENTANYL CITRATE (PF) 50 MCG/ML IJ SOLN
50 ug | INTRAVENOUS | 0 refills | Status: DC | PRN
Start: 2019-03-04 — End: 2019-03-04

## 2019-03-04 MED ORDER — IOPAMIDOL 61 % 50 ML IV SOLN (OT)(OSM)
0 refills | Status: DC
Start: 2019-03-04 — End: 2019-03-04
  Administered 2019-03-04: 16:00:00 50 mL via INTRAMUSCULAR

## 2019-03-04 MED ORDER — ONDANSETRON HCL (PF) 4 MG/2 ML IJ SOLN
INTRAVENOUS | 0 refills | Status: DC
Start: 2019-03-04 — End: 2019-03-04
  Administered 2019-03-04: 17:00:00 4 mg via INTRAVENOUS

## 2019-03-04 MED ORDER — LIDOCAINE (PF) 10 MG/ML (1 %) IJ SOLN
.1-2 mL | INTRAMUSCULAR | 0 refills | Status: DC | PRN
Start: 2019-03-04 — End: 2019-03-04

## 2019-03-04 MED ORDER — CEFAZOLIN INJ 1GM IVP
2 g | Freq: Once | INTRAVENOUS | 0 refills | Status: CP
Start: 2019-03-04 — End: ?
  Administered 2019-03-04: 15:00:00 2 g via INTRAVENOUS

## 2019-03-04 MED ORDER — SODIUM CHLORIDE 0.9 % IV SOLP
1000 mL | INTRAVENOUS | 0 refills | Status: DC
Start: 2019-03-04 — End: 2019-03-04
  Administered 2019-03-04: 14:00:00 1000 mL via INTRAVENOUS

## 2019-03-04 MED ORDER — HYDROMORPHONE (PF) 2 MG/ML IJ SYRG
1 mg | INTRAVENOUS | 0 refills | Status: DC | PRN
Start: 2019-03-04 — End: 2019-03-04

## 2019-03-04 MED ORDER — DEXTRAN 70-HYPROMELLOSE (PF) 0.1-0.3 % OP DPET
0 refills | Status: DC
Start: 2019-03-04 — End: 2019-03-04
  Administered 2019-03-04: 15:00:00 2 [drp] via OPHTHALMIC

## 2019-03-04 MED ORDER — SUGAMMADEX 100 MG/ML IV SOLN
INTRAVENOUS | 0 refills | Status: DC
Start: 2019-03-04 — End: 2019-03-04
  Administered 2019-03-04: 18:00:00 225 mg via INTRAVENOUS

## 2019-03-04 MED ORDER — LIDOCAINE (PF) 200 MG/10 ML (2 %) IJ SYRG
0 refills | Status: DC
Start: 2019-03-04 — End: 2019-03-04
  Administered 2019-03-04: 15:00:00 100 mg via INTRAVENOUS

## 2019-03-04 NOTE — Progress Notes
[facility] History and Physical Examination  03/04/2019     Patient: Grant Olson  MRN: [id number]    Admission Date:  (Not on file), LOS: 0 days  Admission Diagnosis: Nephrolithiasis [N20.0]    ASSESSMENT: 58 y.o. male with history of bilateral urolithiasis s/p bilateral stent placement on 8/1    PLAN:  > Peri-operative Ancef  > To OR for bilateral ureteroscopy, laser lithotripsy and stent placement   > Discussed that largest stone is on RIGHT side, may only perform ureteroscopy on this RIGHT sied  > Risk and benefits of operative procedure were again discussed with the patient in detail, as in clinic; all questions were answered. Informed consent was obtained and placed in the chart.    Discussed plan of care with staff surgeon, Dr. Littie Deeds, who directed plan of care    Conrad Burlington, M.D.  Urology PGY-2  Pager: [phone]  __________________________________________________________________________________    HPI: Grant Olson is a 58 y.o. male with a history of urolithiasis. He has had stones in the past.  He recently presented with acute onset of right renal colic.  He has been found to have an elevated creatinine to almost 2.  Imaging reveals a 1 cm mid ureteral stone on the right and bilateral urolithiasis in the kidneys.    The patient denies any recent significant changes to his health since last seen. Patient denies any history of trouble with anesthesia, bleeding problems, and confirms they have been NPO since midnight.    Patient also specifically denies current fevers, chills, chest pain, SOB, nausea, vomiting, diarrhea, signs and symptoms of UTI.     Denies current anticoagulation therapy.     Medical History:   Diagnosis Date   ??? Hypertension    ??? On supplemental oxygen therapy     CPAP   ??? Thyroid disease        Surgical History:   Procedure Laterality Date   ??? CYSTOURETHROSCOPY WITH URETEROSCOPY AND/ OR PYELOSCOPY - WITh INSERTION URETERAL STENT Bilateral 02/15/2019 Performed by Pennie Banter, MD at [facility] OR   ??? HX TONSILLECTOMY     ??? SHOULDER SURGERY         No family history on file.   Reviewed; non-contributory    Social History     Socioeconomic History   ??? Marital status: Married     Spouse name: Not on file   ??? Number of children: Not on file   ??? Years of education: Not on file   ??? Highest education level: Not on file   Occupational History   ??? Not on file   Social Needs   ??? Financial resource strain: Not on file   ??? Food insecurity     Worry: Not on file     Inability: Not on file   ??? Transportation needs     Medical: Not on file     Non-medical: Not on file   Tobacco Use   ??? Smoking status: Never Smoker   ??? Smokeless tobacco: Never Used   Substance and Sexual Activity   ??? Alcohol use: Yes   ??? Drug use: No   ??? Sexual activity: Not on file   Lifestyle   ??? Physical activity     Days per week: Not on file     Minutes per session: Not on file   ??? Stress: Not on file   Relationships   ??? Social Wellsite geologist on phone: Not on file  Gets together: Not on file     Attends religious service: Not on file     Active member of club or organization: Not on file     Attends meetings of clubs or organizations: Not on file     Relationship status: Not on file   ??? Intimate partner violence     Fear of current or ex partner: Not on file     Emotionally abused: Not on file     Physically abused: Not on file     Forced sexual activity: Not on file   Other Topics Concern   ??? Not on file   Social History Narrative   ??? Not on file        Immunizations (includes history and patient reported):   There is no immunization history on file for this patient.        Allergies:  Patient has no known allergies.    Medications:  Medications Prior to Admission   Medication Sig   ??? acetaminophen (TYLENOL) 325 mg tablet Take two tablets by mouth every 4 hours as needed.   ??? hyoscyamine (ANASPAZ) 0.125 mg rapid dissolve tablet Place one tablet under tongue every 4 hours as needed. ??? levothyroxine (SYNTHROID) 50 mcg tablet Take 50 mcg by mouth daily 30 minutes before breakfast.   ??? losartan (COZAAR) 25 mg tablet Take 25 mg by mouth daily.   ??? metFORMIN (GLUCOPHAGE) 500 mg tablet Take 500 mg by mouth twice daily with meals.   ??? oxyCODONE (ROXICODONE) 5 mg tablet Take one tablet to two tablets by mouth every 4 hours as needed   ??? polyethylene glycol 3350 (MIRALAX) 17 g packet Take one packet by mouth daily.   ??? tamsulosin (FLOMAX) 0.4 mg capsule Take one capsule by mouth daily after breakfast. Do not crush, chew or open capsules. Take 30 minutes following the same meal each day. Take until 3 days after stents are removed to help with stent pain.       Review of Systems:  A 14 point review of systems was negative except for as noted in HPI    Vitals:  Vital Signs: Last Filed In 24 Hours Vital Signs: 24 Hour Range                Intake/Output:  No intake or output data in the 24 hours ending 03/04/19 0624    Physical Exam:   Constitutional: A&Ox4. No acute distress.   HEENT: Normocephalic and atraumatic. Trachea midline.  Cardiovascular: Normal rate and regular rhythm.    Pulmonary/Chest: Effort normal. No respiratory distress. Symmetric chest rise.  Abdominal: Soft. No distension or tenderness.  Psychiatric: Judgment and thought content normal.  Neurologic: No obvious neurologic deficits.  Musculoskeletal: No deformities. Normal range of motion.      Lab/Radiology/Other Diagnostic Tests:  No results for input(s): HGB, HCT, WBC, PLTCT, NA, K, CL, CO2, BUN, CR, GLU, CA, MG, PO4, ALBUMIN, TOTPROT, TOTBILI, AST, ALT, ALKPHOS, AMY, LIPASE, PREALB, INR, PT, PTT in the last 72 hours.       No orders to display

## 2019-03-04 NOTE — Anesthesia Pre-Procedure Evaluation
Anesthesia Pre-Procedure Evaluation    Name: Grant Olson      MRN: [id number]     DOB: [birth year]     Age: 58 y.o.     Sex: male   _________________________________________________________________________     Procedure Info:   Procedure Information     Date/Time:  03/04/19 1005    Procedure:  CYSTOURETHROSCOPY WITH URETEROSCOPY AND/ OR PYELOSCOPY - WITH LITHOTRIPSY INCLUDING INSERTION URETERAL STENT (Bilateral ) - CASE LENGTH 1 HOUR    Location:  MAIN OR 74 / Main OR/Periop    Surgeon:  Sherron Monday, MD          Physical Assessment  Vital Signs (last filed in past 24 hours):  Temp: 36.8 ???C (98.2 ???F) (08/18 0847)  Pulse: 79 (08/18 0847)  Respirations: 25 PER MINUTE (08/18 0847)  SpO2: 99 % (08/18 0847)  Height: 180.3 cm (71) (08/18 0847)  Weight: 111.5 kg (245 lb 12.8 oz) (08/18 0847)    121/76 78 14 99% on RA  Patient History   No Known Allergies     Current Medications    Medication Directions   acetaminophen (TYLENOL) 325 mg tablet Take two tablets by mouth every 4 hours as needed.   hyoscyamine (ANASPAZ) 0.125 mg rapid dissolve tablet Place one tablet under tongue every 4 hours as needed.   levothyroxine (SYNTHROID) 50 mcg tablet Take 50 mcg by mouth daily 30 minutes before breakfast.   losartan (COZAAR) 25 mg tablet Take 25 mg by mouth daily.   metFORMIN (GLUCOPHAGE) 500 mg tablet Take 500 mg by mouth twice daily with meals.   oxyCODONE (ROXICODONE) 5 mg tablet Take one tablet to two tablets by mouth every 4 hours as needed   polyethylene glycol 3350 (MIRALAX) 17 g packet Take one packet by mouth daily.   tamsulosin (FLOMAX) 0.4 mg capsule Take one capsule by mouth daily after breakfast. Do not crush, chew or open capsules. Take 30 minutes following the same meal each day. Take until 3 days after stents are removed to help with stent pain.         Review of Systems/Medical History      Patient summary reviewed  Nursing notes reviewed  Pertinent labs reviewed    PONV Screening: Non-smoker No history of anesthetic complications    Family history of anesthetic complications (Mother): atypical pseudocholinesterase      Airway         Prior intubation - Grade II view with Mac 3      Pulmonary           Not on home oxygen        Sleep apnea          Interventions: CPAP; compliant      Cardiovascular         Exercise tolerance: >4 METS        Hypertension, well controlled      Hyperlipidemia      GI/Hepatic/Renal        Renal disease (GFR 40 on last chemistry (02/16/19))      Right ureteral calculus      Neuro/Psych - negative        Musculoskeletal - negative        Endocrine/Other       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    Abdominal Findings:       Obese    Neurological Findings:       Alert and oriented x 3       Diagnostic Tests  Hematology:   Lab Results   Component Value Date    HGB 13.7 02/16/2019    HCT 38.3 02/16/2019    PLTCT 205 02/16/2019    WBC 7.9 02/16/2019    NEUT 64 02/14/2019    ANC 5.60 02/14/2019    ALC 2.06 02/14/2019    MONA 9 02/14/2019    AMC 0.79 02/14/2019    EOSA 2 02/14/2019    ABC 0.05 02/14/2019    MCV 90.1 02/16/2019    MCH 32.3 02/16/2019    MCHC 35.9 02/16/2019    MPV 8.5 02/16/2019    RDW 13.1 02/16/2019         General Chemistry:   Lab Results   Component Value Date    NA 135 02/16/2019    K 4.1 02/16/2019    CL 100 02/16/2019    CO2 26 02/16/2019    GAP 9 02/16/2019    BUN 22 02/16/2019    CR 1.81 02/16/2019    GLU 207 02/16/2019    CA 9.7 02/16/2019    ALBUMIN 4.8 02/14/2019    TOTBILI 0.8 02/14/2019      Coagulation: No results found for: PT, PTT, INR Anesthesia Plan    ASA score: 3   Plan: general  Induction method: intravenous  NPO status: acceptable      Informed Consent  Anesthetic plan and risks discussed with patient.        Plan discussed with: anesthesiologist and CRNA.

## 2019-03-04 NOTE — Progress Notes
4 Dr. Illene Silver at bedside, pt needs to void prior to dc

## 2019-03-04 NOTE — Anesthesia Post-Procedure Evaluation
Post-Anesthesia Evaluation    Name: Grant Olson      MRN: [id number]     DOB: 10-Sep-1960     Age: 58 y.o.     Sex: male   __________________________________________________________________________     Procedure Information     Anesthesia Start Date/Time:  03/04/19 0933    Procedure:  RIGHT URETEROSCOPY AND RETROGRADE PYELOGRAM - WITH LITHOTRIPSY AND BASKET STONE REMOVAL. RIGHT URETER STENT REPLACEMENT. LEFT URETER STEND REMOVAL (Bilateral Urethra) - CASE LENGTH 1 HOUR    Location:  MAIN OR 52 / Main OR/Periop    Surgeon:  Darden Amber, MD          Post-Anesthesia Vitals  BP: 118/75 (08/18 1315)  Temp: 36.7 C (98.1 F) (08/18 1259)  Pulse: 64 (08/18 1315)  Respirations: 19 PER MINUTE (08/18 1315)  SpO2: 94 % (08/18 1315)  SpO2 Pulse: 64 (08/18 1315)   Vitals Value Taken Time   BP 118/75 03/04/2019  1:15 PM   Temp 36.7 C (98.1 F) 03/04/2019 12:59 PM   Pulse 64 03/04/2019  1:15 PM   Respirations 19 PER MINUTE 03/04/2019  1:15 PM   SpO2 94 % 03/04/2019  1:15 PM         Post Anesthesia Evaluation Note    Evaluation location: Pre/Post  Patient participation: recovered; patient participated in evaluation  Level of consciousness: alert  Pain management: adequate    Hydration: normovolemia  Temperature: 36.0C - 38.4C  Airway patency: adequate    Perioperative Events       Post-op nausea and vomiting: no PONV    Postoperative Status  Cardiovascular status: hemodynamically stable  Respiratory status: spontaneous ventilation  Follow-up needed: none        Perioperative Events  Perioperative Event: No  Emergency Case Activation: No

## 2019-03-06 ENCOUNTER — Encounter: Admit: 2019-03-06 | Discharge: 2019-03-06

## 2019-03-06 DIAGNOSIS — I10 Essential (primary) hypertension: Secondary | ICD-10-CM

## 2019-03-06 DIAGNOSIS — E079 Disorder of thyroid, unspecified: Secondary | ICD-10-CM

## 2019-03-06 DIAGNOSIS — Z9981 Dependence on supplemental oxygen: Secondary | ICD-10-CM

## 2019-03-06 LAB — STONE ANALYSIS: Lab: 90

## 2019-04-02 ENCOUNTER — Ambulatory Visit: Admit: 2019-04-02 | Discharge: 2019-04-03 | Payer: BC Managed Care – HMO

## 2019-04-02 ENCOUNTER — Encounter: Admit: 2019-04-02 | Discharge: 2019-04-02 | Payer: BC Managed Care – HMO

## 2019-04-02 MED ORDER — SULFAMETHOXAZOLE-TRIMETHOPRIM 800-160 MG PO TAB
1 | Freq: Two times a day (BID) | ORAL | 0 refills | Status: DC
Start: 2019-04-02 — End: 2019-04-24

## 2019-04-07 ENCOUNTER — Encounter: Admit: 2019-04-07 | Discharge: 2019-04-07 | Payer: BC Managed Care – HMO

## 2019-04-23 LAB — URINALYSIS DIPSTICK REFLEX TO CULTURE
Lab: NEGATIVE 10*3/uL (ref 3–12)
Lab: NEGATIVE MMOL/L — ABNORMAL HIGH (ref 21–30)
Lab: NEGATIVE U/L (ref 25–110)
Lab: NEGATIVE U/L (ref 7–56)
Lab: NEGATIVE g/dL (ref 3.5–5.0)
Lab: NEGATIVE mg/dL (ref 0.3–1.2)

## 2019-04-23 LAB — COMPREHENSIVE METABOLIC PANEL
Lab: 137 MMOL/L (ref 137–147)
Lab: 54 mL/min — ABNORMAL LOW (ref >60)
Lab: >60 mL/min (ref >60)

## 2019-04-23 LAB — LIPASE: Lab: 31 U/L (ref 11–82)

## 2019-04-23 LAB — URINALYSIS MICROSCOPIC REFLEX TO CULTURE

## 2019-04-23 LAB — POC CREATININE, RAD
Lab: 1.4 mg/dL — ABNORMAL HIGH (ref 0.4–1.24)
Lab: 1.4 mg/dL — ABNORMAL HIGH (ref 0.4–1.24)

## 2019-04-23 LAB — TSH WITH FREE T4 REFLEX: Lab: 4.7 uU/mL (ref 0.35–5.00)

## 2019-04-23 LAB — POC GLUCOSE: Lab: 138 mg/dL — ABNORMAL HIGH (ref 70–100)

## 2019-04-23 LAB — CBC AND DIFF
Lab: 0 10*3/uL (ref 0–0.20)
Lab: 0.6 10*3/uL — ABNORMAL HIGH (ref 0–0.45)
Lab: 6.3 10*3/uL (ref 4.5–11.0)

## 2019-04-23 MED ORDER — DEXTRAN 70-HYPROMELLOSE (PF) 0.1-0.3 % OP DPET
0 refills | Status: DC
Start: 2019-04-23 — End: 2019-04-24
  Administered 2019-04-24: 01:00:00 2 [drp] via OPHTHALMIC

## 2019-04-23 MED ORDER — SODIUM CHLORIDE 0.9 % IV SOLP
1000 mL | Freq: Once | INTRAVENOUS | 0 refills | Status: CP
Start: 2019-04-23 — End: ?
  Administered 2019-04-23: 19:00:00 1000 mL via INTRAVENOUS

## 2019-04-23 MED ORDER — SODIUM CHLORIDE 0.9 % IV SOLP
1000 mL | INTRAVENOUS | 0 refills | Status: DC
Start: 2019-04-23 — End: 2019-04-24
  Administered 2019-04-23: 1000 mL via INTRAVENOUS

## 2019-04-23 MED ORDER — EPHEDRINE SULFATE 50 MG/5ML SYR (10 MG/ML) (AN)(OSM)
0 refills | Status: DC
Start: 2019-04-23 — End: 2019-04-24
  Administered 2019-04-24 (×2): 10 mg via INTRAVENOUS

## 2019-04-23 MED ORDER — DIPHENHYDRAMINE HCL 50 MG/ML IJ SOLN
25 mg | Freq: Once | INTRAVENOUS | 0 refills | Status: DC | PRN
Start: 2019-04-23 — End: 2019-04-24

## 2019-04-23 MED ORDER — LACTATED RINGERS IV SOLP
INTRAVENOUS | 0 refills | Status: DC
Start: 2019-04-23 — End: 2019-04-24

## 2019-04-23 MED ORDER — FENTANYL CITRATE (PF) 50 MCG/ML IJ SOLN
50 ug | Freq: Once | INTRAVENOUS | 0 refills | Status: CP
Start: 2019-04-23 — End: ?
  Administered 2019-04-23: 18:00:00 50 ug via INTRAVENOUS

## 2019-04-23 MED ORDER — POLYETHYLENE GLYCOL 3350 17 GRAM PO PWPK
17 g | Freq: Every day | ORAL | 0 refills | 18.00000 days | Status: DC
Start: 2019-04-23 — End: 2019-05-07

## 2019-04-23 MED ORDER — FENTANYL CITRATE (PF) 50 MCG/ML IJ SOLN
50 ug | Freq: Once | INTRAVENOUS | 0 refills | Status: CP
Start: 2019-04-23 — End: ?
  Administered 2019-04-23: 21:00:00 50 ug via INTRAVENOUS

## 2019-04-23 MED ORDER — PATCH DOCUMENTATION - SCOPOLAMINE BASE 1 MG/72HR
1 | Freq: Two times a day (BID) | TRANSDERMAL | 0 refills | Status: DC
Start: 2019-04-23 — End: 2019-04-24

## 2019-04-23 MED ORDER — HYOSCYAMINE SULFATE 0.125 MG SL SUBL
125 ug | ORAL_TABLET | SUBLINGUAL | 0 refills | Status: DC | PRN
Start: 2019-04-23 — End: 2019-05-07

## 2019-04-23 MED ORDER — ACETAMINOPHEN 500 MG PO TAB
500 mg | ORAL_TABLET | ORAL | 0 refills | Status: DC | PRN
Start: 2019-04-23 — End: 2019-05-07

## 2019-04-23 MED ORDER — FENTANYL CITRATE (PF) 50 MCG/ML IJ SOLN
50 ug | INTRAVENOUS | 0 refills | Status: DC | PRN
Start: 2019-04-23 — End: 2019-04-24

## 2019-04-23 MED ORDER — PROPOFOL INJ 10 MG/ML IV VIAL
0 refills | Status: DC
Start: 2019-04-23 — End: 2019-04-24
  Administered 2019-04-24: 01:00:00 200 mg via INTRAVENOUS

## 2019-04-23 MED ORDER — DEXAMETHASONE SODIUM PHOSPHATE 4 MG/ML IJ SOLN
INTRAVENOUS | 0 refills | Status: DC
Start: 2019-04-23 — End: 2019-04-24
  Administered 2019-04-24: 01:00:00 4 mg via INTRAVENOUS

## 2019-04-23 MED ORDER — ONDANSETRON HCL (PF) 4 MG/2 ML IJ SOLN
INTRAVENOUS | 0 refills | Status: DC
Start: 2019-04-23 — End: 2019-04-24
  Administered 2019-04-24: 02:00:00 4 mg via INTRAVENOUS

## 2019-04-23 MED ORDER — TAMSULOSIN 0.4 MG PO CAP
0.4 mg | ORAL_CAPSULE | Freq: Every day | ORAL | 3 refills | 90.00000 days | Status: DC
Start: 2019-04-23 — End: 2019-05-07

## 2019-04-23 MED ORDER — FENTANYL CITRATE (PF) 50 MCG/ML IJ SOLN
0 refills | Status: DC
Start: 2019-04-23 — End: 2019-04-24
  Administered 2019-04-24 (×2): 50 ug via INTRAVENOUS

## 2019-04-23 MED ORDER — NEOSTIGMINE METHYLSULFATE 1 MG/ML IJ SOLN
0 refills | Status: DC
Start: 2019-04-23 — End: 2019-04-24
  Administered 2019-04-24: 01:00:00 5 mg via INTRAVENOUS

## 2019-04-23 MED ORDER — SCOPOLAMINE BASE 1 MG OVER 3 DAYS TD PT3D
1 | Freq: Once | TRANSDERMAL | 0 refills | Status: DC
Start: 2019-04-23 — End: 2019-04-24
  Administered 2019-04-24: 1 via TRANSDERMAL

## 2019-04-23 MED ORDER — ROCURONIUM 10 MG/ML IV SOLN
INTRAVENOUS | 0 refills | Status: DC
Start: 2019-04-23 — End: 2019-04-24
  Administered 2019-04-24 (×2): 20 mg via INTRAVENOUS

## 2019-04-23 MED ORDER — SODIUM CHLORIDE 0.9 % IV SOLP
1000 mL | Freq: Once | INTRAVENOUS | 0 refills | Status: CP
Start: 2019-04-23 — End: ?
  Administered 2019-04-23: 18:00:00 1000 mL via INTRAVENOUS

## 2019-04-23 MED ORDER — GLYCOPYRROLATE 0.2 MG/ML IJ SOLN
0 refills | Status: DC
Start: 2019-04-23 — End: 2019-04-24
  Administered 2019-04-24: 01:00:00 .9 mg via INTRAVENOUS

## 2019-04-23 MED ORDER — TRAMADOL 50 MG PO TAB
50 mg | ORAL_TABLET | ORAL | 0 refills | Status: DC | PRN
Start: 2019-04-23 — End: 2019-05-07

## 2019-04-23 MED ORDER — OXYCODONE 5 MG PO TAB
5-10 mg | Freq: Once | ORAL | 0 refills | Status: DC | PRN
Start: 2019-04-23 — End: 2019-04-24

## 2019-04-23 MED ORDER — LIDOCAINE (PF) 200 MG/10 ML (2 %) IJ SYRG
0 refills | Status: DC
Start: 2019-04-23 — End: 2019-04-24
  Administered 2019-04-24: 01:00:00 60 mg via INTRAVENOUS

## 2019-04-23 MED ORDER — CEFAZOLIN 1 GRAM IJ SOLR
0 refills | Status: DC
Start: 2019-04-23 — End: 2019-04-24
  Administered 2019-04-24: 01:00:00 2 g via INTRAVENOUS

## 2019-04-23 MED ORDER — HALOPERIDOL LACTATE 5 MG/ML IJ SOLN
1 mg | Freq: Once | INTRAVENOUS | 0 refills | Status: DC | PRN
Start: 2019-04-23 — End: 2019-04-24

## 2019-04-23 NOTE — ED Notes
58 yo male ambulatory from triage w/ SO to FTA 46 w/ cc of flank pain. Pt reports getting a stent in his R kidney removed on 9/16. Since then, pt reports R flank pain. Pt reports hx of kidney stones "that's why they put stents in each kidney." Pt denies problems urinating, blood in urine, pain w/ urination, or abd pain. Pt endorses some nausea w/o vomiting. Pt is A&O x4, respirations even and NL, pulses palp bilaterally.Cart locked in lowest position, bed rails raised x2, call light within reach

## 2019-04-23 NOTE — ED Provider Notes
Grant Olson is a 58 y.o. male.    Chief Complaint:  Chief Complaint   Patient presents with   ? Flank Pain     Patient reports right flank pain since stent removal on 9/16       History of Present Illness:  Grant Olson is a 57 y.o. male, with a history of HTN, thyroid disease, and nephrolithiasis s/p stent placement 02/15/2019 and removal 04/02/2019, who presents to the emergency department for flank pain. Patient with complaints of constant, non-radiating right sided flank pain for the past 3 days. States similar to pain with previous kidney stones. He states the pain is constant, waxes and wanes, sharp, moderate to severe. Otherwise denies any fever, coughs, CP, SOB, abdominal pain, nausea, vomiting, diarrhea, constipation, dysuria, hematuria, difficulty urinating, or decrease urination.  Patient has been taking tylenol, oxycodone and tramadol that he had left over from prior evaluation. Social drinker. Denies any smoking or illicit drug use.       History provided by:  Parent  Language interpreter used: No        Review of Systems:  Review of Systems   Constitutional: Negative for chills, fatigue and fever.   HENT: Negative.    Eyes: Negative for visual disturbance.   Respiratory: Negative for cough and shortness of breath.    Cardiovascular: Negative for chest pain, palpitations and leg swelling.   Gastrointestinal: Negative for abdominal pain, blood in stool, constipation, diarrhea, nausea and vomiting.   Genitourinary: Positive for flank pain. Negative for decreased urine volume, difficulty urinating, dysuria and hematuria.   Musculoskeletal: Negative for myalgias, neck pain and neck stiffness.   Skin: Negative for color change, rash and wound.   Neurological: Negative for dizziness, syncope, weakness, numbness and headaches.   Psychiatric/Behavioral: Negative for confusion.   All other systems reviewed and are negative.      Allergies:  Patient has no known allergies. Past Medical History:  Medical History:   Diagnosis Date   ? Hypertension    ? On supplemental oxygen therapy     CPAP   ? Thyroid disease        Past Surgical History:  Surgical History:   Procedure Laterality Date   ? CYSTOURETHROSCOPY WITH URETEROSCOPY AND/ OR PYELOSCOPY - WITh INSERTION URETERAL STENT Bilateral 02/15/2019    Performed by Pennie Banter, MD at [facility] OR   ? RIGHT URETEROSCOPY AND RETROGRADE PYELOGRAM - WITH LITHOTRIPSY AND BASKET STONE REMOVAL. RIGHT URETER STENT REPLACEMENT. LEFT URETER STEND REMOVAL Bilateral 03/04/2019    Performed by Sherron Monday, MD at [facility] OR   ? HX TONSILLECTOMY     ? SHOULDER SURGERY         Pertinent medical/surgical history reviewed  Medical History:   Diagnosis Date   ? Hypertension    ? On supplemental oxygen therapy     CPAP   ? Thyroid disease      Surgical History:   Procedure Laterality Date   ? CYSTOURETHROSCOPY WITH URETEROSCOPY AND/ OR PYELOSCOPY - WITh INSERTION URETERAL STENT Bilateral 02/15/2019    Performed by Pennie Banter, MD at [facility] OR   ? RIGHT URETEROSCOPY AND RETROGRADE PYELOGRAM - WITH LITHOTRIPSY AND BASKET STONE REMOVAL. RIGHT URETER STENT REPLACEMENT. LEFT URETER STEND REMOVAL Bilateral 03/04/2019    Performed by Sherron Monday, MD at [facility] OR   ? HX TONSILLECTOMY     ? SHOULDER SURGERY         Social History:  Social History  Tobacco Use   ? Smoking status: Never Smoker   ? Smokeless tobacco: Never Used   Substance Use Topics   ? Alcohol use: Yes   ? Drug use: No     Social History     Substance and Sexual Activity   Drug Use No       Family History:  No family history on file.    Vitals:  ED Vitals    Date and Time T BP P RR SPO2P SPO2 User   04/23/19 1125 36.2 ?C (97.2 ?F) 163/86 -- 18 PER MINUTE 81 98 % RF          Physical Exam:  Physical Exam  Vitals signs and nursing note reviewed.   Constitutional:       General: He is awake. He is not in acute distress.     Appearance: Normal appearance. He is well-developed. He is not ill-appearing, toxic-appearing or diaphoretic.   HENT:      Head: Normocephalic and atraumatic.      Right Ear: External ear normal.      Left Ear: External ear normal.      Nose: Nose normal.   Eyes:      General: Lids are normal.      Conjunctiva/sclera: Conjunctivae normal.   Neck:      Musculoskeletal: Normal range of motion and neck supple. No neck rigidity.   Cardiovascular:      Rate and Rhythm: Normal rate and regular rhythm.      Pulses: Normal pulses.      Heart sounds: Normal heart sounds.   Pulmonary:      Effort: Pulmonary effort is normal. No respiratory distress.      Breath sounds: Normal breath sounds and air entry. No stridor. No decreased breath sounds, wheezing, rhonchi or rales.   Abdominal:      General: There is no distension.      Palpations: Abdomen is soft.      Tenderness: There is no abdominal tenderness. There is no right CVA tenderness, left CVA tenderness, guarding or rebound.      Comments: Tenderness to palpation of R sided back, inferior to R CVA   Musculoskeletal: Normal range of motion.         General: No deformity or signs of injury.      Right lower leg: No edema.      Left lower leg: No edema.   Skin:     General: Skin is warm and dry.      Capillary Refill: Capillary refill takes less than 2 seconds.      Findings: No rash.   Neurological:      General: No focal deficit present.      Mental Status: He is alert and oriented to person, place, and time.      Motor: No weakness.   Psychiatric:         Mood and Affect: Mood normal.         Behavior: Behavior normal.         Laboratory Results:  Labs Reviewed   CBC AND DIFF - Abnormal       Result Value Ref Range Status    White Blood Cells 6.3  4.5 - 11.0 K/UL Final    RBC 4.59  4.4 - 5.5 M/UL Final    Hemoglobin 14.2  13.5 - 16.5 GM/DL Final    Hematocrit 96.0  40 - 50 % Final    MCV 89.4  80 -  100 FL Final    MCH 30.9  26 - 34 PG Final    MCHC 34.6  32.0 - 36.0 G/DL Final    RDW 81.1  11 - 15 % Final Platelet Count 208  150 - 400 K/UL Final    MPV 8.1  7 - 11 FL Final    Neutrophils 56  41 - 77 % Final    Lymphocytes 26  24 - 44 % Final    Monocytes 7  4 - 12 % Final    Eosinophils 10 (*) 0 - 5 % Final    Basophils 1  0 - 2 % Final    Absolute Neutrophil Count 3.55  1.8 - 7.0 K/UL Final    Absolute Lymph Count 1.60  1.0 - 4.8 K/UL Final    Absolute Monocyte Count 0.46  0 - 0.80 K/UL Final    Absolute Eosinophil Count 0.60 (*) 0 - 0.45 K/UL Final    Absolute Basophil Count 0.04  0 - 0.20 K/UL Final   COMPREHENSIVE METABOLIC PANEL - Abnormal    Sodium 137  137 - 147 MMOL/L Final    Potassium 4.5  3.5 - 5.1 MMOL/L Final    Chloride 101  98 - 110 MMOL/L Final    Glucose 258 (*) 70 - 100 MG/DL Final    Blood Urea Nitrogen 17  7 - 25 MG/DL Final    Creatinine 9.14 (*) 0.4 - 1.24 MG/DL Final    Calcium 78.2  8.5 - 10.6 MG/DL Final    Total Protein 7.2  6.0 - 8.0 G/DL Final    Total Bilirubin 0.5  0.3 - 1.2 MG/DL Final    Albumin 4.6  3.5 - 5.0 G/DL Final    Alk Phosphatase 101  25 - 110 U/L Final    AST (SGOT) 33  7 - 40 U/L Final    CO2 26  21 - 30 MMOL/L Final    ALT (SGPT) 55  7 - 56 U/L Final    Anion Gap 10  3 - 12 Final    eGFR Non African American 54 (*) >60 mL/min Final    eGFR African American >60  >60 mL/min Final   URINALYSIS DIPSTICK REFLEX TO CULTURE - Abnormal    Color,UA STRAW   Final    Turbidity,UA CLEAR  CLEAR-CLEAR Final    Specific Gravity-Urine 1.012  1.003 - 1.035 Final    pH,UA 6.0  5.0 - 8.0 Final    Protein,UA NEG  NEG-NEG Final    Glucose,UA 3+ (*) NEG-NEG Final    Ketones,UA NEG  NEG-NEG Final    Bilirubin,UA NEG  NEG-NEG Final    Blood,UA NEG  NEG-NEG Final    Urobilinogen,UA NORMAL  NORM-NORMAL Final    Nitrite,UA NEG  NEG-NEG Final    Leukocytes,UA NEG  NEG-NEG Final    Urine Ascorbic Acid, UA NEG  NEG-NEG Final   POC CREATININE, RAD - Abnormal    Creatinine, POC 1.4 (*) 0.4 - 1.24 MG/DL Final   POC CREATININE, RAD - Abnormal    Creatinine, POC 1.4 (*) 0.4 - 1.24 MG/DL Final NFAOZ-30 (SARS-COV-2) PCR   LIPASE    Lipase 31  11 - 82 U/L Final   URINALYSIS MICROSCOPIC REFLEX TO CULTURE    WBCs,UA 0-2  0 - 2 /HPF Final    RBCs,UA 0-2  0 - 3 /HPF Final    Comment,UA     Final    Value: Criteria for reflex to  culture are WBC>10, Positive Nitrite, and/or >=+1   leukocytes. If quantity is not sufficient, an addendum will follow.      MucousUA TRACE   Final   TSH WITH FREE T4 REFLEX    TSH 4.79  0.35 - 5.00 MCU/ML Final   UA REFLEX CULTURE LABEL   POC CREATININE   POC CREATININE          Radiology Interpretation:    CT ABD/PELV WO CONTRAST   Final Result      Bilateral nephrolithiasis with development of right UVJ calculus since February 14, 2019 without significant hydroureteronephrosis      Precious Reel, M.D. discussed these findings with Tyrone Apple, APRN by telephone 04/23/2019 1:31 PM          Approved by Precious Reel, M.D. on 04/23/2019 1:33 PM      By my electronic signature, I attest that I have personally reviewed the images for this examination and formulated the interpretations and opinions expressed in this report          Finalized by Merlene Laughter, M.D. on 04/23/2019 2:15 PM. Dictated by Precious Reel, M.D. on 04/23/2019 1:18 PM.             EKG:    None    ED Course:    Pt seen and evaluated in the ED for R flank pain with hx of nephrolithiasis s/p stent placement 02/15/2019 and removal 04/02/2019   Labs ordered and reviewed, noted for creatinine level and repeat creatine post IV fluids 1.4, otherwise unremarkable.    CT abd/pelvis ordered and reviewed, noted for Bilateral nephrolithiasis with development of right UVJ calculus since February 14, 2019 without significant hydroureteronephrosis  NS boluses and Fentanyl given with reported relief.   Urology consulted and in room to see pt. Recommended admission to medicine.   AOD consulted for admission.   Pt awaiting for admission in stable condition.       MDM  Reviewed: nursing note, vitals and previous chart  Interpretation: labs and CT scan Consults: Admitting Provider and Urology        Facility Administered Meds:  Medications   sodium chloride 0.9 %   infusion (0 mL Intravenous Infusion Stopped 04/23/19 1335)   fentaNYL citrate PF (SUBLIMAZE) injection 50 mcg (50 mcg Intravenous Given 04/23/19 1239)   sodium chloride 0.9 %   infusion (0 mL Intravenous Infusion Stopped 04/23/19 1445)   fentaNYL citrate PF (SUBLIMAZE) injection 50 mcg (50 mcg Intravenous Given 04/23/19 1619)       Clinical Impression:  Clinical Impression   Calculus of ureterovesical junction (UVJ)   AKI (acute kidney injury) (HCC)   Bilateral nephrolithiasis       Disposition/Follow up  ED Disposition     ED Disposition    Admit        No follow-up provider specified.    Medications:  New Prescriptions    No medications on file       Procedure Notes:  Procedures      Attestation / Supervision:    I, Daryel November, am scribing for and in the presence of Tyrone Apple, APRN.    Daryel November    Attestation / Supervision Note concerning Asmar Certo Honda: The service was provided by the APP alone with immediate availability of a physician in the ED. and I, Otis Peak, APRN-NP, personally performed the services described in this documentation as scribed in my presence and it is both accurate and complete.  Otis Peak, APRN-NP

## 2019-04-23 NOTE — Care Coordination-Inpatient
AOD was contacted for admission initially, went down to see the patient, he told me that Dr. Truman Hayward has seen him and the plan was for him to go to the OR and not be admitted.  Discussed with Dr. Truman Hayward from urology, they will take patient to the OR around 6 PM and after that he could go home.  Informed ED nurse about this and I canceled the AOD consult given the change in plan.  If something changes, ED to contact AOD again

## 2019-04-24 LAB — POC GLUCOSE: Lab: 134 mg/dL — ABNORMAL HIGH (ref 70–100)

## 2019-04-24 NOTE — Operative Report(Direct Entry)
OPERATIVE REPORT    Name: Grant Olson is a 58 y.o. male     DOB: 1960-08-17             MRN#: [id number]    DATE OF OPERATION: 04/23/2019    Surgeon(s) and Role:     France Ravens, MD - Primary     * Loralie Champagne, MD - Resident - Assisting     Preoperative Diagnosis:     1. Bilateral nephrolithiasis   2. Right obstructing ureteral stone   3. Acute kidney injury    Post-op Diagnosis    Same    Procedure:    1. Cystoscopy   2. Right ureteroscopy with stone extraction   3. 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 He opted for definitive stone treatment. He had no signs or symptoms of infection. Risks/benefits were reviewed and he opted to proceed.     Description of Operative Procedure:   After informed consent, the patient was brought back to the operating suite and moved over to the operating table. General anesthesia was smoothly induced, IV antibiotics were administered, and the patient was placed in the dorsal lithotomy position with careful attention focused on padding all pressure points. The patient was prepped and draped in standard fashion. A timeout was performed to ensure the correct patient and procedure    A 22Fr cystoscope was used to cannulate the urethra. The urethra was of normal course and caliber. The prostatic urethra was notable for a very mildly enlarged prostate. Upon entering the bladder, pan-cystoscopy revealed no bladder abnormalities. There were no stones, diverticuli, or trabeculations. The bilateral ureteral orifices were visualized in their orthotopic positions. Attention was then turned to the right ureteral orifice which was cannulated with a sensor wire. This was advanced into the right kidney under fluoroscopic guidance. The bladder was drained and the cystoscope was removed. The wire was secured as a safety wire.     The semi-rigid ureteroscope was then inserted back into the bladder. The right ureter was cannulated. The ureteroscope was advanced into the distal ureter where a large stone was seen. However, the ureter was very capacious so we opted to insert an escape basket and remove the stone. No stone analysis was sent since this had been sent at time of his prior operation. Through the semi-rigid scope, an additional sensor wire was advanced gently into the right renal pelvis. The semi-rigid ureteroscope was then back fed out of the ureter and removed. The super stiff wire functioned as our working wire.    We then switched to pressure-bag irrigation and a flexible ureteroscope was advanced over the second guidewire into the renal pelvis under live fluoroscopy and the second guidewire was removed. Pan-pyeloscopy was then performed which revealed multiple randall's plaques. Two larger stones were identified in the lower pole which were removed and dropped into the bladder with basket extraction. The remainder of the kidney was  surveyed without residual stone. A retrograde pyelogram was obtained to ensure all calyces were evaluated and no residual stone remained. There were two stones that were approx 1 mm in size and were too small to be removed with the basket. The ureter was inspected as the flexible ureteroscope was removed and found to be free of any injuries or stone.    We switched back to the rigid cystoscope. The stone fragments were removed. We then reinserted the cystoscope over the existing guidewire. A 45F x 26cm double J ureteral stent was advanced up the right ureter under direct visualization which confirmed good curl in the bladder. Fluoroscopy confirmed good curl in the kidney. The bladder was drained and this concluded our procedure.     The patient was left in the OR per COVID precautions with anesthesia for extubation. All scopes and instruments were in good working order at the end of the case. There were no complications.    Dr. Nedra Hai was present and scrubbed for the entire procedure.     Estimated Blood Loss: Minimal    Implants: Stent ON STRINGS   Implant Name Type Inv. Item Serial No. Manufacturer Lot No. LRB No. Used Action   STENT URETERAL 45FR 26CM PIGTAIL CURVE TAPER TIP BLADDER MARK_D - [id number]  STENT URETERAL 45FR 26CM PIGTAIL CURVE TAPER TIP BLADDER MARK_D [id number] [employer] [id number] Right 1 Implanted     Drains: None    Complications: None    Specimens: None    Disposition:    - The patient is considered stone free on the right side  - Patient to self pull stent on 04/25/19 AM at 0700 AM  - Scripts at discharge included: tramadol, acetaminophen, tamsulosin, levsin  - Follow up: Already scheduled with Dr. Luther Redo on 10/21 with Dr. Luther Redo. Keep appointment. Likely ultrasound in 3 months, ordered    Loralie Champagne, MD  PGY4 Urology Resident

## 2019-04-24 NOTE — Anesthesia Pre-Procedure Evaluation
Anesthesia Pre-Procedure Evaluation    Name: Grant Olson      MRN: [id number]     DOB: [birth year]     Age: 58 y.o.     Sex: male   _________________________________________________________________________     Procedure Info:   Procedure Information     Date/Time:  04/23/19 1800    Procedure:  CYSTOURETHROSCOPY WITH URETEROSCOPY AND/ OR PYELOSCOPY - WITH LITHOTRIPSY INCLUDING INSERTION URETERAL STENT (Right )    Location:  MAIN OR 27 / Main OR/Periop    Surgeon:  France Ravens, MD          Physical Assessment  Vital Signs (last filed in past 24 hours):  BP: 173/90 (10/07 1835)  Temp: 37.1 ?C (98.8 ?F) (10/07 1835)  Pulse: 69 (10/07 1835)  Respirations: 18 PER MINUTE (10/07 1835)  SpO2: 100 % (10/07 1835)  Height: 180.3 cm (71) (10/07 1835)  Weight: 111.1 kg (245 lb) (10/07 1835)    121/76 78 14 99% on RA  Patient History   No Known Allergies     Current Medications    Medication Directions   atorvastatin (LIPITOR) 40 mg tablet Take 40 mg by mouth daily.   HYDROcodone/acetaminophen (NORCO) 5/325 mg tablet Take 1 tablet by mouth every 4 hours as needed for Pain   levothyroxine (SYNTHROID) 50 mcg tablet Take 50 mcg by mouth daily 30 minutes before breakfast.   losartan (COZAAR) 25 mg tablet Take 25 mg by mouth daily.   metFORMIN (GLUCOPHAGE) 500 mg tablet Take 500 mg by mouth twice daily with meals.   oxyCODONE (ROXICODONE) 5 mg tablet Take one tablet to two tablets by mouth every 4 hours as needed   tramadol HCl (TRAMADOL PO) Take  by mouth.         Review of Systems/Medical History      Patient summary reviewed  Nursing notes reviewed  Pertinent labs reviewed    PONV Screening: Non-smoker  No history of anesthetic complications    Family history of anesthetic complications (Mother): atypical pseudocholinesterase      Airway         Prior intubation - Grade II view with Mac 3      Pulmonary - negative          Not on home oxygen        Sleep apnea          Interventions: CPAP; compliant      Cardiovascular Exercise tolerance: >4 METS        Hypertension, well controlled      Hyperlipidemia      GI/Hepatic/Renal        Renal disease (AKI on CKD)      Right ureteral calculus      Neuro/Psych - negative        Musculoskeletal         Back pain      Endocrine/Other       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    Abdominal Findings:       Obese      Abdomen soft    Neurological Findings:       Alert and oriented x 3    Constitutional findings:       No acute distress       Diagnostic Tests  Hematology:   Lab Results   Component Value Date    HGB 14.2 04/23/2019    HCT 41.1 04/23/2019    PLTCT 208 04/23/2019    WBC 6.3 04/23/2019    NEUT 56 04/23/2019    ANC 3.55 04/23/2019    ALC 1.60 04/23/2019    MONA 7 04/23/2019    AMC 0.46 04/23/2019    EOSA 10 04/23/2019    ABC 0.04 04/23/2019    MCV 89.4 04/23/2019    MCH 30.9 04/23/2019    MCHC 34.6 04/23/2019    MPV 8.1 04/23/2019    RDW 14.4 04/23/2019         General Chemistry:   Lab Results   Component Value Date    NA 137 04/23/2019    K 4.5 04/23/2019    CL 101 04/23/2019    CO2 26 04/23/2019    GAP 10 04/23/2019    BUN 17 04/23/2019    CR 1.4 04/23/2019    CR 1.36 04/23/2019    GLU 258 04/23/2019    CA 10.2 04/23/2019    ALBUMIN 4.6 04/23/2019    TOTBILI 0.5 04/23/2019      Coagulation: No results found for: PT, PTT, INR      Anesthesia Plan    ASA score: 3   Plan: general  Induction method: intravenous  NPO status: acceptable      Informed Consent  Anesthetic plan and risks discussed with patient.  Use of blood products discussed with patient  Blood Consent: consented Plan discussed with: anesthesiologist and CRNA.

## 2019-04-24 NOTE — Progress Notes
Signed out by Anesthesia. Discharge instructions given to both patient and wife, both verbalizes understanding. Patient able to pass out urine prior to going home. Transported to main lobby via wheelchair.

## 2019-04-24 NOTE — Anesthesia Post-Procedure Evaluation
Post-Anesthesia Evaluation    Name: Grant Olson      MRN: [id number]     DOB: [birth year]     Age: 58 y.o.     Sex: male   __________________________________________________________________________     Procedure Information     Anesthesia Start Date/Time:  04/23/19 1931    Procedure:  CYSTOURETHROSCOPY WITH URETEROSCOPY AND/ OR PYELOSCOPY - WITH STONE BASKETING (Right )    Location:  MAIN OR 27 / Main OR/Periop    Surgeon:  Nyra Market, MD          Post-Anesthesia Vitals  BP: 160/84 (10/07 2130)  Temp: 36.4 C (97.5 F) (10/07 2045)  Pulse: 76 (10/07 2130)  Respirations: 19 PER MINUTE (10/07 2130)  SpO2: 96 % (10/07 2130)  SpO2 Pulse: 76 (10/07 2130)   Vitals Value Taken Time   BP 160/84 04/23/2019  9:30 PM   Temp 36.4 C (97.5 F) 04/23/2019  8:45 PM   Pulse 76 04/23/2019  9:30 PM   Respirations 19 PER MINUTE 04/23/2019  9:30 PM   SpO2 96 % 04/23/2019  9:30 PM         Post Anesthesia Evaluation Note    Evaluation location: Pre/Post  Patient participation: recovered; patient participated in evaluation  Level of consciousness: alert    Pain score: 2  Pain management: adequate    Hydration: normovolemia  Temperature: 36.0C - 38.4C  Airway patency: adequate    Perioperative Events       Post-op nausea and vomiting: no PONV    Postoperative Status  Cardiovascular status: hypertensive and hemodynamically stable (asymptomatic)  Respiratory status: spontaneous ventilation  Follow-up needed: none        Perioperative Events  Perioperative Event: No  Emergency Case Activation: No

## 2019-04-25 ENCOUNTER — Encounter: Admit: 2019-04-25 | Discharge: 2019-04-25 | Payer: BC Managed Care – HMO

## 2019-04-25 DIAGNOSIS — Z9981 Dependence on supplemental oxygen: Secondary | ICD-10-CM

## 2019-04-25 DIAGNOSIS — I10 Essential (primary) hypertension: Secondary | ICD-10-CM

## 2019-04-25 DIAGNOSIS — E079 Disorder of thyroid, unspecified: Secondary | ICD-10-CM

## 2019-05-07 ENCOUNTER — Encounter: Admit: 2019-05-07 | Discharge: 2019-05-07 | Payer: BC Managed Care – HMO

## 2019-05-07 ENCOUNTER — Ambulatory Visit: Admit: 2019-05-07 | Discharge: 2019-05-08 | Payer: BC Managed Care – HMO

## 2019-05-07 DIAGNOSIS — E079 Disorder of thyroid, unspecified: Secondary | ICD-10-CM

## 2019-05-07 DIAGNOSIS — Z9981 Dependence on supplemental oxygen: Secondary | ICD-10-CM

## 2019-05-07 DIAGNOSIS — N200 Calculus of kidney: Principal | ICD-10-CM

## 2019-05-07 DIAGNOSIS — I10 Essential (primary) hypertension: Secondary | ICD-10-CM

## 2019-05-07 NOTE — Patient Instructions
Please complete Litholink in one month and follow up with Dr. Estevan Ryder in two months.

## 2019-05-07 NOTE — Assessment & Plan Note
58 year old male with recurrent nephrolithiasis who most recently presented 10/7 for right flank pain and was found to have a 4mm right distal ureteral stone as well as non-obstructing bilateral renal stones. Patient underwent cystoscopy with right ureteroscopy with stone extraction on 04/23/2019.  Patient returns to clinic today for follow-up.  He is doing well since his procedure.  He is considered stone free on the right side, however, he still has several very small stones in his left collecting system.    Etiology and management of kidney stones discussed with patient in detail.  At this time as his left-sided stones are very small and nonobstructive we discussed continued observation.  Additionally, we discussed stone prevention with stone diet.  Given his history of multiple kidney stones we will also plan to evaluate with a 24-hour urine collection.    Litholink in 1 month  Return to clinic in 2 months with Litholink results

## 2019-05-07 NOTE — Progress Notes
Date of Service: 05/07/2019     Subjective:             Grant Olson is a 58 y.o. male who presents for eval.    History of Present Illness    Grant Olson is a 58 year old male with recurrent nephrolithiasis who most recently presented 10/7 for right flank pain and was found to have a 6mm right distal ureteral stone as well as non-obstructing bilateral renal stones. Patient underwent cystoscopy with right ureteroscopy with stone extraction on 04/23/2019. Patient is considered stone free on the right side. He pulled his stent at home on 04/25/2019 without issue.    Patient returns to clinic today for follow up. He states since his procedure he has been doing well. He does note a constant dull right flank pain since then. He has not passed any additional stones. His CT was reviewed and it appears he has several small stones in his left collecting system. Denies abdominal pain, hematuria, dysuria, fevers, chills at this time.     Medical History:   Diagnosis Date   ? Hypertension    ? On supplemental oxygen therapy     CPAP   ? Thyroid disease        Surgical History:   Procedure Laterality Date   ? CYSTOURETHROSCOPY WITH URETEROSCOPY AND/ OR PYELOSCOPY - WITh INSERTION URETERAL STENT Bilateral 02/15/2019    Performed by Pennie Banter, MD at [facility] OR   ? RIGHT URETEROSCOPY AND RETROGRADE PYELOGRAM - WITH LITHOTRIPSY AND BASKET STONE REMOVAL. RIGHT URETER STENT REPLACEMENT. LEFT URETER STEND REMOVAL Bilateral 03/04/2019    Performed by Sherron Monday, MD at [facility] OR   ? CYSTOURETHROSCOPY WITH URETEROSCOPY AND/ OR PYELOSCOPY - WITH STONE BASKETING Right 04/23/2019    Performed by France Ravens, MD at [facility] OR   ? HX TONSILLECTOMY     ? KIDNEY STONE SURGERY     ? SHOULDER SURGERY         History reviewed. No pertinent family history.    Current Outpatient Medications   Medication Sig Dispense Refill   ? atorvastatin (LIPITOR) 40 mg tablet Take 40 mg by mouth daily. ? ibuprofen (ADVIL PO) Take  by mouth as Needed.     ? levothyroxine (SYNTHROID) 50 mcg tablet Take 50 mcg by mouth daily 30 minutes before breakfast.     ? losartan (COZAAR) 25 mg tablet Take 25 mg by mouth daily.     ? metFORMIN (GLUCOPHAGE) 500 mg tablet Take 500 mg by mouth twice daily with meals.     ? other medication 1 Dose. Medication Name & Strength: [employer] Sleep aid    Dose(how many): 2 tabs    Frequency(how often): Every night       No current facility-administered medications for this visit.        No Known Allergies    Social History     Socioeconomic History   ? Marital status: Married     Spouse name: Not on file   ? Number of children: Not on file   ? Years of education: Not on file   ? Highest education level: Not on file   Occupational History   ? Not on file   Tobacco Use   ? Smoking status: Never Smoker   ? Smokeless tobacco: Never Used   Substance and Sexual Activity   ? Alcohol use: Yes   ? Drug use: No   ? Sexual activity: Yes  Partners: Female   Other Topics Concern   ? Not on file   Social History Narrative   ? Not on file       Review of Systems   Constitutional: Negative for activity change, appetite change, chills, fatigue, fever and unexpected weight change.   Respiratory: Negative for chest tightness and shortness of breath.    Cardiovascular: Negative for chest pain and palpitations.   Gastrointestinal: Negative for abdominal pain.   Genitourinary: Positive for flank pain. Negative for difficulty urinating, dysuria, hematuria and urgency.   Skin: Negative for color change, rash and wound.   Neurological: Negative for dizziness.   Psychiatric/Behavioral: Negative for agitation and behavioral problems.       Objective:         ? atorvastatin (LIPITOR) 40 mg tablet Take 40 mg by mouth daily.   ? ibuprofen (ADVIL PO) Take  by mouth as Needed.   ? levothyroxine (SYNTHROID) 50 mcg tablet Take 50 mcg by mouth daily 30 minutes before breakfast. ? losartan (COZAAR) 25 mg tablet Take 25 mg by mouth daily.   ? metFORMIN (GLUCOPHAGE) 500 mg tablet Take 500 mg by mouth twice daily with meals.   ? other medication 1 Dose. Medication Name & Strength: [employer] Sleep aid    Dose(how many): 2 tabs    Frequency(how often): Every night     Vitals:    05/07/19 1117   BP: (!) 151/77   Pulse: 68   Weight: 111.1 kg (245 lb)   Height: 180.3 cm (71)   PainSc: Three     Body mass index is 34.17 kg/m?Marland Kitchen     Physical Exam   Constitutional: He is oriented to person, place, and time. He appears well-developed and well-nourished.   HENT:   Head: Normocephalic and atraumatic.   Eyes: Pupils are equal, round, and reactive to light.   Neck: Normal range of motion. Neck supple.   Cardiovascular: Normal rate and regular rhythm.   Pulmonary/Chest: Effort normal. No respiratory distress.   Abdominal: Soft. He exhibits no distension. There is no abdominal tenderness.   Musculoskeletal: Normal range of motion.   Neurological: He is alert and oriented to person, place, and time.   Skin: Skin is warm and dry.   Psychiatric: His behavior is normal. Judgment and thought content normal.          Assessment and Plan:    Problem   Nephrolithiasis    02/14/2019: Obstructing R 1cm stone. Bilateral smaller stones  03/04/2019: R side URS/LL  04/23/2019: Obstructing R 6mm stone. R side stone free. Residual L small stones  05/07/2019: Doing well post operatively.        Nephrolithiasis  58 year old male with recurrent nephrolithiasis who most recently presented 10/7 for right flank pain and was found to have a 6mm right distal ureteral stone as well as non-obstructing bilateral renal stones. Patient underwent cystoscopy with right ureteroscopy with stone extraction on 04/23/2019.  Patient returns to clinic today for follow-up.  He is doing well since his procedure.  He is considered stone free on the right side, however, he still has several very small stones in his left collecting system. Etiology and management of kidney stones discussed with patient in detail.  At this time as his left-sided stones are very small and nonobstructive we discussed continued observation.  Additionally, we discussed stone prevention with stone diet.  Given his history of multiple kidney stones we will also plan to evaluate with a 24-hour urine  collection.    ?Litholink in 1 month  ?Return to clinic in 2 months with Litholink results    Patient seen and discussed with Dr. Luther Redo, who directed plan of care.    Jane Canary, MD  PGY-2 Urology       ATTESTATION    I personally performed the key portions of the E/M visit, discussed case with resident and concur with resident documentation of history, physical exam, assessment, and treatment plan unless otherwise noted.    Staff name:  Beckie Busing, MD, [facility] Date:  05/07/2019

## 2019-06-20 IMAGING — CR LOW_EXM
3 series · 3 of 3 positions shown · non-contrast
Comparison: none

[foot]
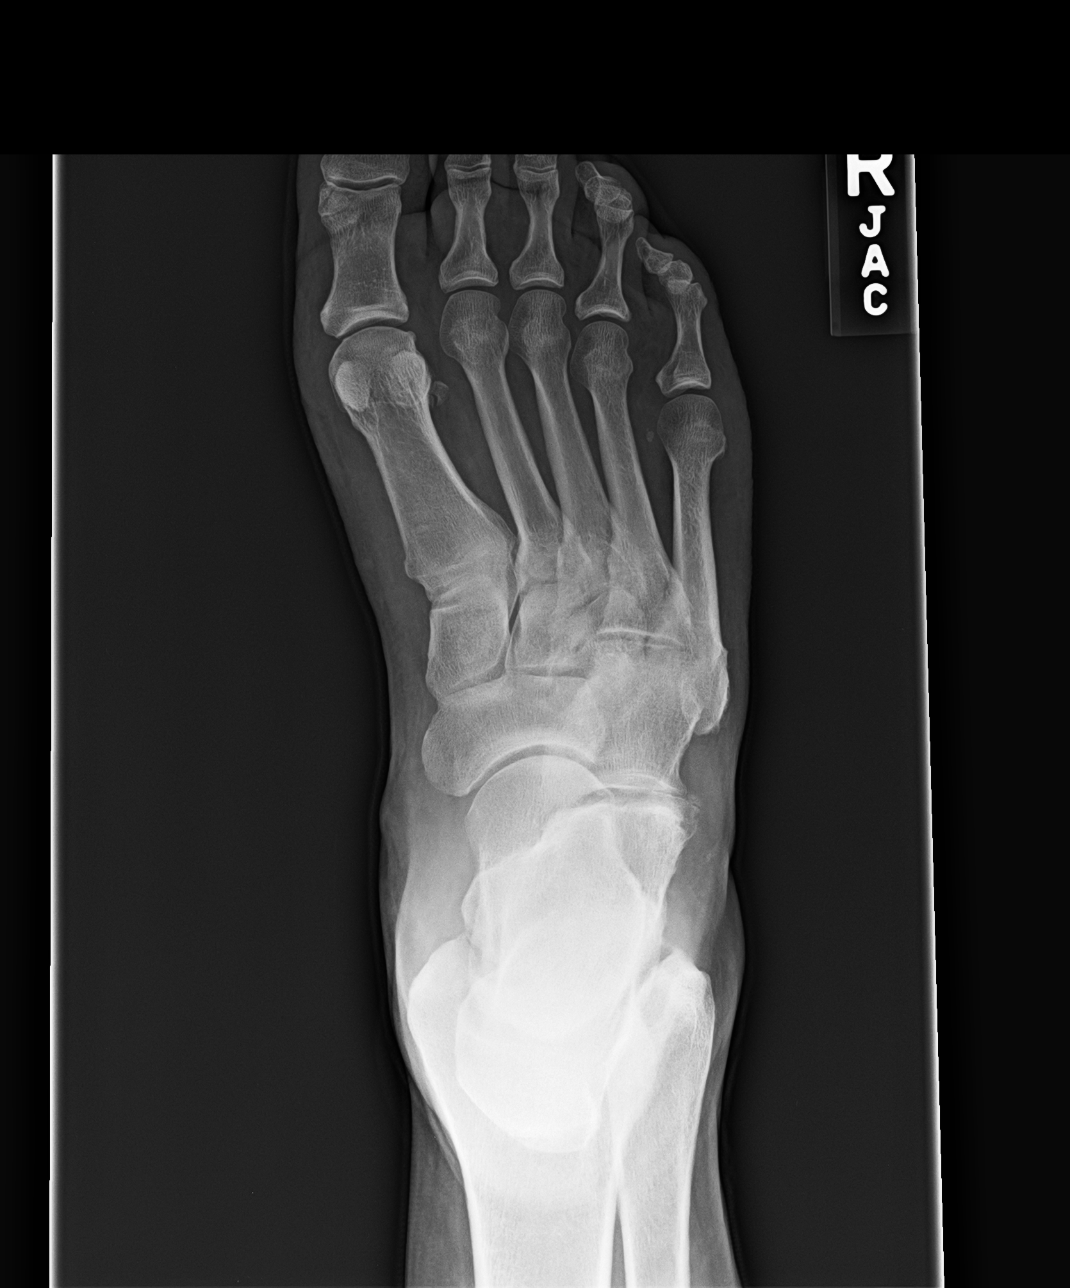

[foot obl]
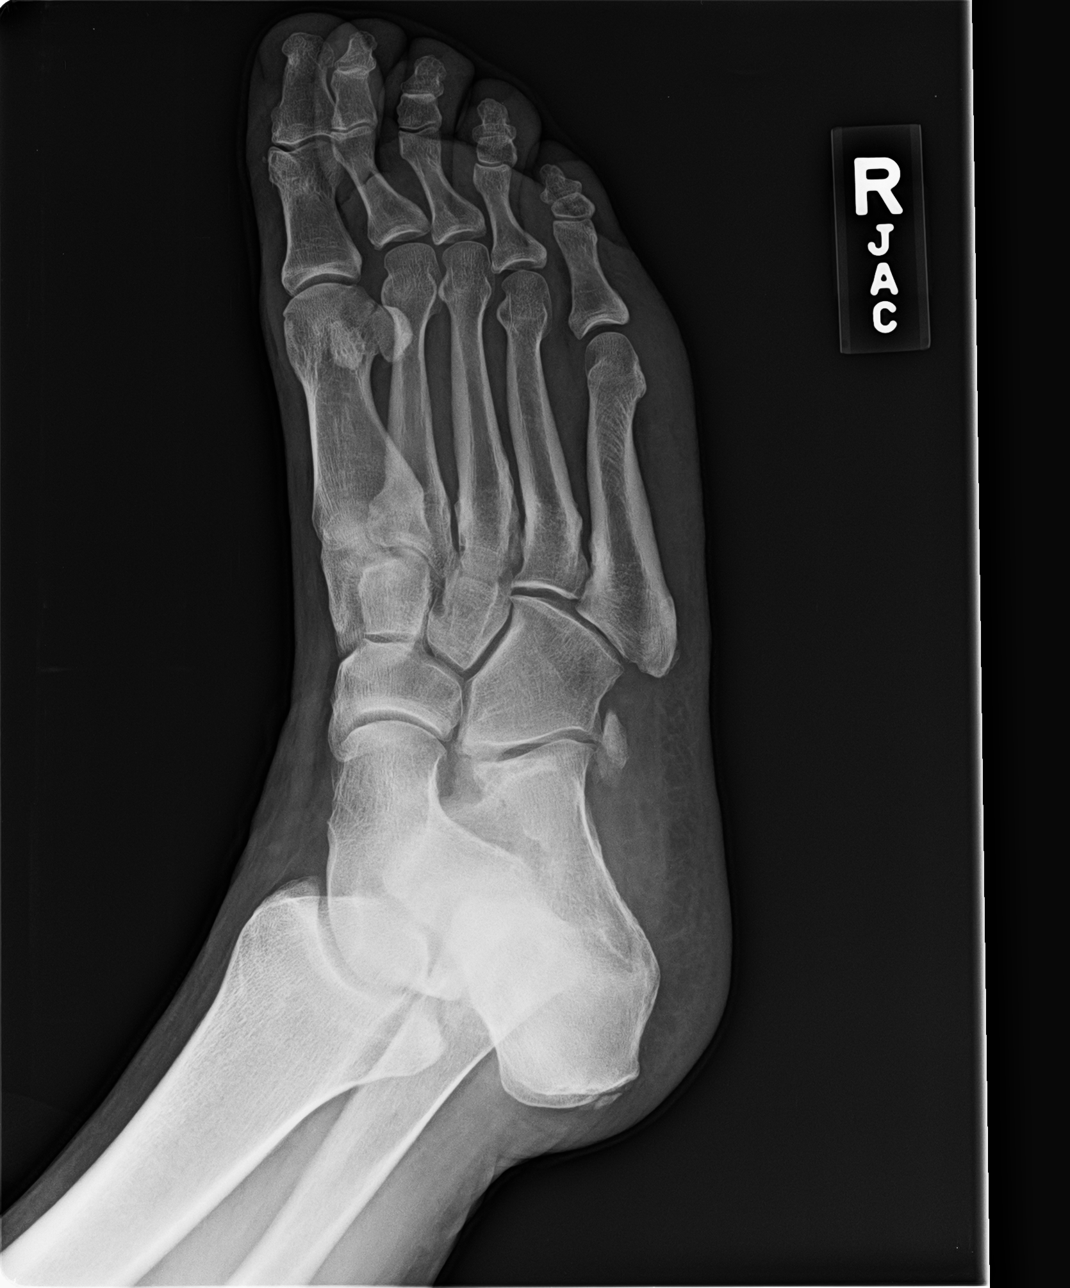

[foot lat]
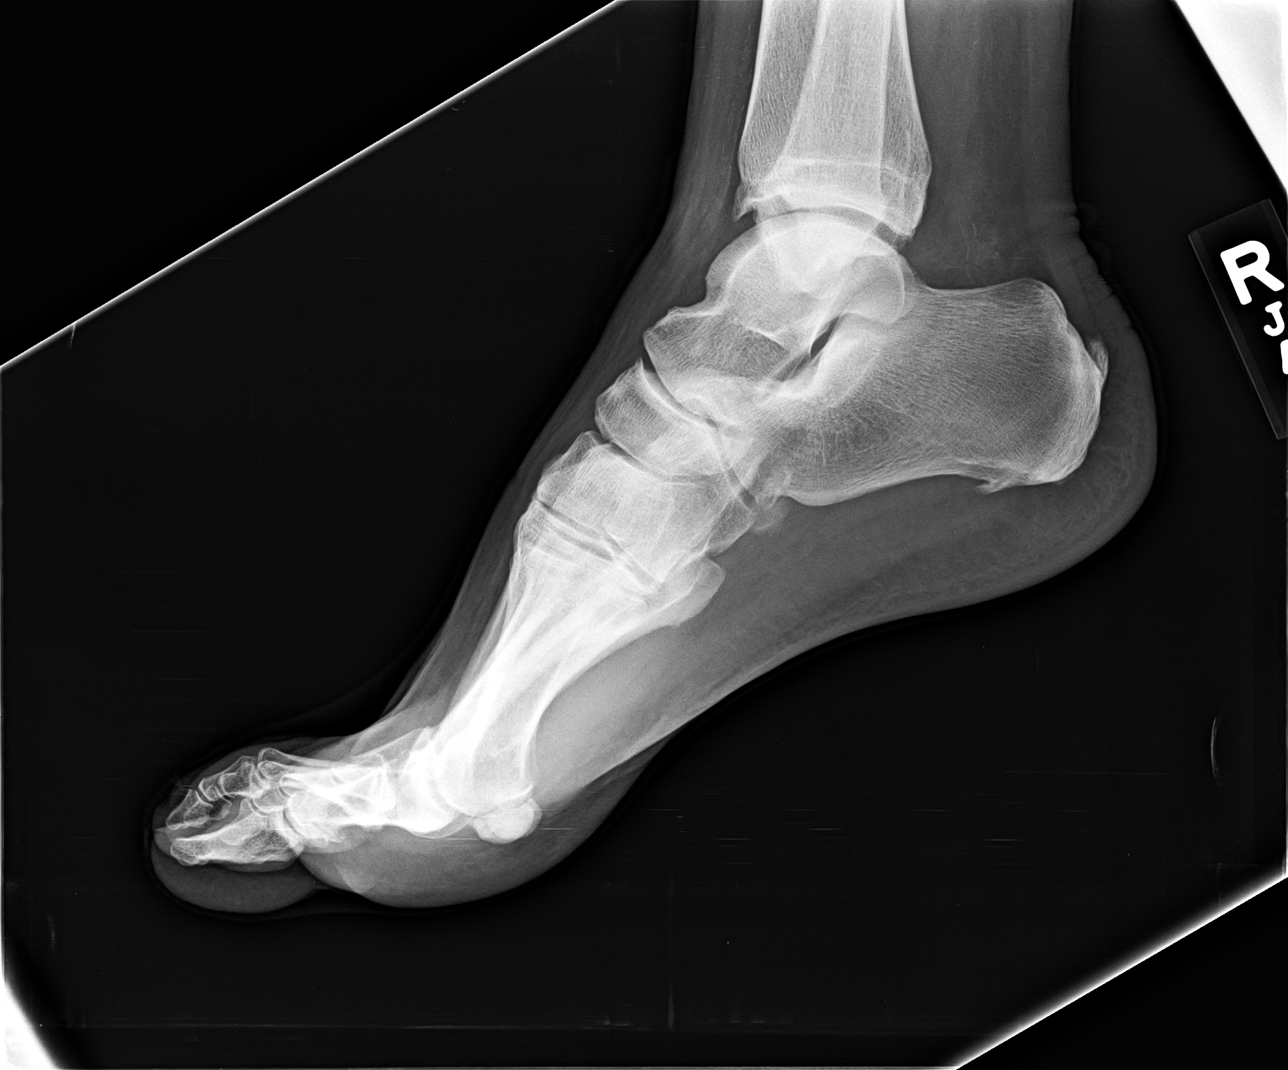

[3 of 3 positions shown; findings below may reference images not displayed]

EXAM

Right foot.

INDICATION

right plantar heel pain. No injury. Hx bone spur.
Pt states Rt plantar heel pain. No known injury. H/O bone spure. JC/CP

FINDINGS

Three views of the right foot were obtained.

There is no fracture or dislocation.  There are small osteophytes of the distal tibia.

There are small heel spurs at the insertion site of the plantar fascia and Achilles tendon.

IMPRESSION

There is no fracture or acute appearing abnormality of the right foot.  Small heel spurs are
present.

Tech Notes:

Pt states Rt plantar heel pain. No known injury. H/O bone spure. JC/CP

## 2019-07-03 ENCOUNTER — Encounter: Admit: 2019-07-03 | Discharge: 2019-07-03 | Payer: BC Managed Care – HMO

## 2019-07-03 ENCOUNTER — Ambulatory Visit: Admit: 2019-07-03 | Discharge: 2019-07-04 | Payer: BC Managed Care – HMO

## 2019-07-03 DIAGNOSIS — N200 Calculus of kidney: Secondary | ICD-10-CM

## 2019-07-03 DIAGNOSIS — E079 Disorder of thyroid, unspecified: Secondary | ICD-10-CM

## 2019-07-03 DIAGNOSIS — Z9981 Dependence on supplemental oxygen: Secondary | ICD-10-CM

## 2019-07-03 DIAGNOSIS — I10 Essential (primary) hypertension: Secondary | ICD-10-CM

## 2019-07-03 MED ORDER — POTASSIUM CITRATE 15 MEQ PO TBER
15 meq | ORAL_TABLET | Freq: Three times a day (TID) | ORAL | 3 refills | 30.00000 days | Status: AC
Start: 2019-07-03 — End: ?

## 2019-07-03 NOTE — Patient Instructions
Department of Urology    Dietary Management of Kidney Stones    If you have had kidney stones before, you are more likely to have them again. To help prevent kidney stone formation, try to:  ? Drink 6 to 8 glasses of water each day    The single most important change for patients is the need to increase their daily intake of fluids. Each patient should strive to produce 2 quarts of urine a day. Depending on the heat outdoors, your level of physical activity you may need to increase your intake of fluid to ten, 10 ounce glasses of fluid every day. Water is always the best choice, but other drinks are allowed as long as water is among the choices made.  ? Eat less salt (sodium), meat and eggs    Too much salt in your diet is bad for your blood pressure, heart and also increases the amount of calcium in your urine. Patients should target for sodium intake of 2,000 to 2,200 mg a day, which is approximately the size of a teaspoon.    High Protein diets from animal meat such as beef, chicken, port and fish can increase the rate of kidney stones forming, this is also unhealthy for your heart. Meat intake should be around 3 to 7 ounces per day.     Maintain dietary calcium intake of 600 to 1,100 mg per day      Food Sources of Calcium  Gruyere Cheese 1 oz 287 mg Instant Oatmeal ? cup 163 mg   Mozzarella Cheese 1 oz 207 mg 2% Cottage Cheese 1 cup 155 mg   Cheddar Cheese 1 oz 204 mg Broccoli 1 stalk 150 mg   Yogurt ? cup 200 mg Pizza 1 slice 150 mg   Macaroni & Cheese ? cup 200 mg Milk ? cup 150 mg   Homemade Waffle, 1 179 mg Buttermilk ? cup 150 mg   Vanilla Ice Cream 1 Cup 176 mg Baked Custard ? cup 149 mg   Ice Milk 1 cup 176 mg Pudding ? cup 146 mg   American Cheese ? cup 174 mg Blackstrap Molasses 1 T  137 mg   Ricotta Cheese ? cup 167 mg Instant Nonfat Dry Milk 2 T 105 mg DO NOT Limit your calcium intake unless directed by your physician. Calcium is best in moderation and can be found in a variety of foods including but not limited to: small serving of yogurt, milk or ice cream. Maintaining the range of 600 to 1,000 mg per day helps avoid over absorption of oxalate (stone forming foods) from the digestive tract and allows for healthy bone maintenance.  ? Oxalate foods in moderation      Most patients who have a history of kidney stones should avoid heavy intake of rich oxalate foods. These include green roughage, spinach, mustard, kale, strawberries, chocolate, tea and nuts. High intake levels of Vitamin C can also raise the oxalate levels in your urine. It is recommended that patients who have oxalate or calcium oxalate stones should have no more than 50 mg of oxalate per day in the diet. As a reminder, these foods are found mostly in foods that come from a plant.     Foods High in Oxalate  (More than 10 mg = ? cup serving)  ? Beans (string or wax)  ? Legume types (including baked beans canned in tomato sauce)  ? Beets  ? Blackberries  ? Carob powder  ? Celery  ?  Chocolate/cocoa, other chocolate drink mixes  ? Dark leafy greens  ? Spinach  ? Swiss chard  ? Beet greens  ? Endive, escarole  ? Parsley  ? Draft beer  ? Fruit cake  ? Eggplant ? Gooseberries  ? Grits (white corn)  ? Instant coffee (more than 8/oz day)  ? Leeks  ? Nuts, nut butter  ? Okra  ? Peel: lemon, lime, orange  ? Raspberries (black)  ? Red currants  ? Rhubarb  ? Soy products (tofu)  ? Spinach  ? Strawberries  ? Summer squash  ? Sweet potatoes  ? Tea  ? Wheat bran  ? Wheat germ       ? Increase insoluble fiber Fiber is the indigestible part of plants. There are two types of fiber: soluble and insoluble, Insoluble fiber is found in wheat, rye, barley, and rice. Insoluble fiber (that which does not dissolve in water) speeds up movement of substances through the intestine, so there will be less time for calcium to be absorbed.     Overall, it is important to increase fluid intake as the mainstay of stone prevention.

## 2019-07-03 NOTE — Progress Notes
Obtained patient's verbal consent to treat them and their agreement to [facility] financial policy and NPP via this telehealth visit during the [facility] Emergency      Assessment and Plan:   In summary, 58 year old male status post right ureteroscopy 2 months ago.  Asymptomatic today.  Stone analysis showed 10% uric acid and 90% calcium oxalate stone.  No new image study today.  24-hour urine analysis showed low urinary pH, urine  uric acid and high oxalate.  Plan is to keep good water intake, decrease oxalate intake, decrease protein intake, keep up with calcium intake and start potassium citrate 15 mEq 3 times daily.  Discussed with patient side effects of potassium citrate:  Side effects of KCit are:  1. Abdominal or stomach discomfort   2. diarrhea   3. nausea   4. vomiting   Get emergency help immediately if any of the following symptoms of overdose occur:  Symptoms of overdose  1. Abdominal or stomach pain   2. confusion   3. difficult breathing   4. irregular heartbeat   5. nervousness   6. numbness or tingling in the hands, feet, or lips   7. shortness of breath   8. weakness or heaviness of the legs     Patient to have a new 24 urine analysis in 3 months, renal ultrasound in a month and return to the clinic in 4 months.                Subjective:       History of Present Illness  Grant Olson is a 58 y.o. male.  58 year old male who returns to clinic today for a telehealth visit after last visit on May 07, 2019.  Patient history of uric acid and calcium oxalate stone s/p right ureteroscopy on October 2020.  Stent was removed week afterwards.  Since stent removal, patient is doing well.  Denies any major urinary symptoms.  Denied flank pain, back pain or hematuria.  Had a 24 urine analysis performed prior to this visit but no new image study.     Review of Systems    A 14 point ROS was performed and is negative except as noted in the HPI    Objective: ? atorvastatin (LIPITOR) 40 mg tablet Take 40 mg by mouth daily.   ? ibuprofen (ADVIL PO) Take  by mouth as Needed.   ? levothyroxine (SYNTHROID) 50 mcg tablet Take 50 mcg by mouth daily 30 minutes before breakfast.   ? losartan (COZAAR) 25 mg tablet Take 25 mg by mouth daily.   ? metFORMIN (GLUCOPHAGE) 500 mg tablet Take 500 mg by mouth twice daily with meals.   ? other medication 1 Dose. Medication Name & Strength: [employer] Sleep aid    Dose(how many): 2 tabs    Frequency(how often): Every night     Body mass index is 34.17 kg/m?Marland Kitchen   There were no vitals filed for this visit.    General: No acute distress, comfortable.  HEENT: Normocephalic, atraumatic, good dentition  CV: Normal capillary refill visualized  Resp: Normal effort  Abdomen: non distended in appearance  Extremities: Moves all extremities spontaneously.  No edema visualized  Skin:  dry  Neurologic: Alert and oriented x 3.  Psych: Normal thought, behavior, and judgement      24 urine analysis:  Urine volume?3.26   Urine calcium?124  Urine oxalate 108  Urinary pH?5.37   saturation uric acid?1.91  Urine uric acid?1.147  Total time 20 minutes.  Estimated 15 minutes spent counseling the patient regarding treatment and management of kidney stones.

## 2019-08-06 ENCOUNTER — Encounter

## 2019-08-06 DIAGNOSIS — N200 Calculus of kidney: Secondary | ICD-10-CM

## 2020-04-12 ENCOUNTER — Encounter: Admit: 2020-04-12 | Discharge: 2020-04-12 | Payer: BC Managed Care – HMO

## 2020-04-12 NOTE — Telephone Encounter
Pt calling to report he is having flank pain like he did with his last stone. Pain level is 5-6/10. Pt would like to know what he should do. Pt canceled his April appt and stopped taking his K+ Citrate because he wasn't having any issues. He wants to know what he should do. He started his Flomax and K+ Citrate. I told him I would send this to his nurse. He agrees with this plan.

## 2020-04-20 ENCOUNTER — Encounter: Admit: 2020-04-20 | Discharge: 2020-04-20 | Payer: BC Managed Care – HMO

## 2020-04-20 DIAGNOSIS — N200 Calculus of kidney: Secondary | ICD-10-CM

## 2020-05-03 ENCOUNTER — Encounter: Admit: 2020-05-03 | Discharge: 2020-05-03 | Payer: BC Managed Care – HMO

## 2020-05-12 ENCOUNTER — Ambulatory Visit: Admit: 2020-05-12 | Discharge: 2020-05-12 | Payer: BC Managed Care – HMO

## 2020-05-12 ENCOUNTER — Encounter: Admit: 2020-05-12 | Discharge: 2020-05-12 | Payer: BC Managed Care – HMO

## 2020-05-12 DIAGNOSIS — N201 Calculus of ureter: Secondary | ICD-10-CM

## 2020-05-12 DIAGNOSIS — Z9981 Dependence on supplemental oxygen: Secondary | ICD-10-CM

## 2020-05-12 DIAGNOSIS — E079 Disorder of thyroid, unspecified: Secondary | ICD-10-CM

## 2020-05-12 DIAGNOSIS — I10 Essential (primary) hypertension: Secondary | ICD-10-CM

## 2020-05-12 DIAGNOSIS — N200 Calculus of kidney: Secondary | ICD-10-CM

## 2020-05-12 MED ORDER — POTASSIUM CITRATE 15 MEQ PO TBER
15 meq | ORAL_TABLET | Freq: Three times a day (TID) | ORAL | 3 refills | 30.00000 days | Status: AC
Start: 2020-05-12 — End: ?

## 2020-05-12 NOTE — Progress Notes
ReSKU Follow-up Note  Date: 05/12/2020     Subjective   Grant Olson was seen in the Urology faculty practice clinic at [facility] for a follow-up visit regarding their nephrolithiasis. As you know, he is a 59 y.o. male with bilateral urolithiasis. These stones have been present for 10 year.     He reports one symptomatic stone event since their last visit.   These were associated with right flank pain.  Since their last visit, the patient underwent no procedures for stones.      At their last visit, the plan was for imaging.     The patient was started on potassium citrate at their last visit.   The patient reports not taking his stone related medications as prescribed.   no diet change was recommended at the patient's last visit.   He did not have any new recommended diet changes from their last visit.     Not taking Kcit due to nausea. Pain on the right lower back worse by end of the day     The patient's past medical, surgical, medication, allergy, family, and social histories were reviewed and noncontributory to this illness/condition except as noted below:     The patient's past medical history is notable for:  Medical History:   Diagnosis Date   ? Hypertension    ? On supplemental oxygen therapy     CPAP   ? Thyroid disease          The patient's past surgical history is notable for:  Surgical History:   Procedure Laterality Date   ? CYSTOURETHROSCOPY WITH URETEROSCOPY AND/ OR PYELOSCOPY - WITh INSERTION URETERAL STENT Bilateral 02/15/2019    Performed by Pennie Banter, MD at [facility] OR   ? RIGHT URETEROSCOPY AND RETROGRADE PYELOGRAM - WITH LITHOTRIPSY AND BASKET STONE REMOVAL. RIGHT URETER STENT REPLACEMENT. LEFT URETER STEND REMOVAL Bilateral 03/04/2019    Performed by Sherron Monday, MD at [facility] OR   ? CYSTOURETHROSCOPY WITH URETEROSCOPY AND/ OR PYELOSCOPY - WITH STONE BASKETING Right 04/23/2019    Performed by France Ravens, MD at [facility] OR   ? HX TONSILLECTOMY     ? KIDNEY STONE SURGERY     ? SHOULDER SURGERY The patient's past family history is notable for:  No family history on file.      The patient's past social history is notable for:  Social History     Socioeconomic History   ? Marital status: Married     Spouse name: Not on file   ? Number of children: Not on file   ? Years of education: Not on file   ? Highest education level: Not on file   Occupational History   ? Not on file   Tobacco Use   ? Smoking status: Never Smoker   ? Smokeless tobacco: Never Used   Vaping Use   ? Vaping Use: Never used   Substance and Sexual Activity   ? Alcohol use: Yes   ? Drug use: No   ? Sexual activity: Yes     Partners: Female   Other Topics Concern   ? Not on file   Social History Narrative   ? Not on file         Complete review of systems was performed and reviewed today. It is notable for:   Review of Systems   Constitutional: Negative for fever, chills, weight loss and malaise/fatigue.   HENT: Negative for hearing loss, neck pain and tinnitus.  Eyes: Negative for blurred vision, double vision and photophobia.   Respiratory: Negative for cough, sputum production and wheezing.    Cardiovascular: Negative for chest pain and palpitations.   Gastrointestinal: Negative for heartburn, nausea, vomiting, abdominal pain, diarrhea and constipation.   Genitourinary: Negative for dysuria, urgency and frequency.   Musculoskeletal: Negative for back pain and joint pain.   Skin: Negative for itching and rash.   Neurological: Negative for dizziness, tingling, focal weakness and headaches.   Endo/Heme/Allergies: Does not bruise/bleed easily.   Psychiatric/Behavioral: Negative for depression. The patient is not nervous/anxious.        The patient is currently taking the following medications:  Current Outpatient Medications   Medication Sig Dispense Refill   ? atorvastatin (LIPITOR) 40 mg tablet Take 40 mg by mouth daily.     ? ibuprofen (ADVIL PO) Take  by mouth as Needed.     ? levothyroxine (SYNTHROID) 50 mcg tablet Take 50 mcg by mouth daily 30 minutes before breakfast.     ? losartan (COZAAR) 25 mg tablet Take 25 mg by mouth daily.     ? metFORMIN (GLUCOPHAGE) 500 mg tablet Take 500 mg by mouth twice daily with meals.     ? other medication 1 Dose. Medication Name & Strength: [employer] Sleep aid    Dose(how many): 2 tabs    Frequency(how often): Every night     ? potassium citrate (UROCIT-K) 15 mEq tablet Take one tablet by mouth three times daily. Take with food. 270 tablet 3     No current facility-administered medications for this visit.        The patient is allergic to:   No Known Allergies     Objective   During the physical examination today, the patients vital signs were  BP (!) 159/98 (BP Source: Arm, Right Upper, Patient Position: Sitting)  - Pulse 89  - Temp 36.4 ?C (97.5 ?F) (Skin)  - Ht 180.3 cm (71)  - Wt 108.9 kg (240 lb)  - BMI 33.47 kg/m?       General: alert and oriented times three, pleasant, in no acute distress  HEENT: Normocephalic, atraumatic  Neck: normal range of motion  Chest: breathing is unlabored  Abdomen: soft, non-tender, non-distended  Back: no costovertebral tenderness  Extremities: no clubbing, cyanosis, or edema  Psychiatric: mood and affect are normal, the patient is socially appropriate  Neurologic: sensation is grossly intact in the upper and lower extremities         Lab Review and Imaging     The following lab results were personally reviewed by me:     Lab Results   Component Value Date    WBC 6.3 04/23/2019    HGB 14.2 04/23/2019    HCT 41.1 04/23/2019    MCV 89.4 04/23/2019      Lab Results   Component Value Date    CA 10.2 04/23/2019      Lab Results   Component Value Date    NA 137 04/23/2019    K 4.5 04/23/2019    CL 101 04/23/2019    CO2 26 04/23/2019      No results found for: CREATININE      The patient had a 24-hour urine collection performed - this was personally reviewed and results were discussed with the patient. It demonstrates the following:     No new 24 h urine       Stone analysis from the patient's last stone was personally reviewed and  results were discussed with the patient. This demonstrated:  No flowsheet data found.     The following imaging was personally reviewed and the findings were discussed with the patient. This demonstrated: small stones less than 5 mm on the left side mainly, no hydro      Fatigue and Pain Assessment 05/12/2020 07/03/2019 05/07/2019 04/02/2019   Pain Score 6 0 3 2   Pain Loc 4 - 4 4                It was a pleasure seeing your patient in the Urology Faculty Practice clinic today.      I addressed urolithiasis with the patient today,       Based on today's visit, we will plan for imaging, 24 hour urine collection and continuing medications}.      Additional workup ordered today included kcit 15 TID, renal US and 24 h urine .     In Summary pt with hx of nephrolithiasis and  back pain.  Last Image study showed small non-obstructive stone. I spent 20 minutes with patient explaining the it is very unlikely that the source of the back pain is the small non-obstructive kidney stone. Most likely related to musculoskeletal system. My recommendations are: discuss PCP other soursec of pain,physical activity plan.     Diet modifications :    Drink plenty of water in order to urinate 2.5 liters a day (83 Oz)  Reduce your sodium ingestion to less than 1500mg  a day  Have a regular calcium diet (1200mg )  Drink lemon juice (fresh squeezed lemon or lyme) 175ml/day (4Oz)  Avoid food rich in oxalate -check link (PoolFood.fr)    Pt with a episode of stone has a 50% chance to have a new episode within 5 years     Thank you for allowing [place] to participate in the care of this patient. Please do not hesitate to contact us should you have any questions or concerns.

## 2020-09-17 ENCOUNTER — Encounter: Admit: 2020-09-17 | Discharge: 2020-09-17 | Payer: BC Managed Care – HMO

## 2020-09-17 MED ORDER — POTASSIUM CITRATE 15 MEQ PO TBER
ORAL_TABLET | Freq: Three times a day (TID) | 3 refills | Status: AC
Start: 2020-09-17 — End: ?

## 2020-10-06 IMAGING — CT CALCIUM_SCORING(Adult)
2 series · 15 of 20 positions shown, 17 images · non-contrast
Comparison: none

[Series 2: calcium scoring 3.00 qr36 bestdiast 66% · axial · 0.41mm/px · z∈[+1911,+2004]mm · 7 of 84 slices shown, 9 images]
[im 11/84  vessel]
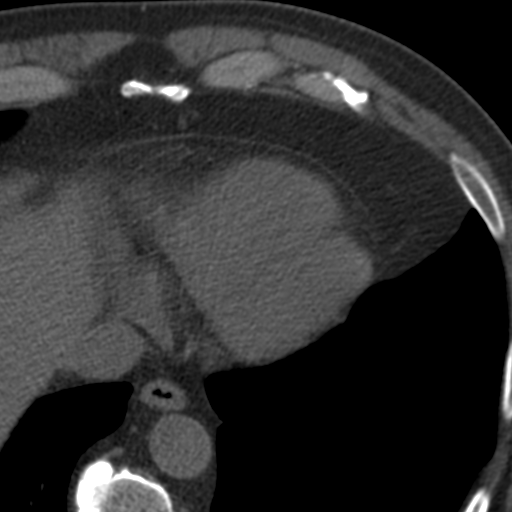
[im 11/84  lung]
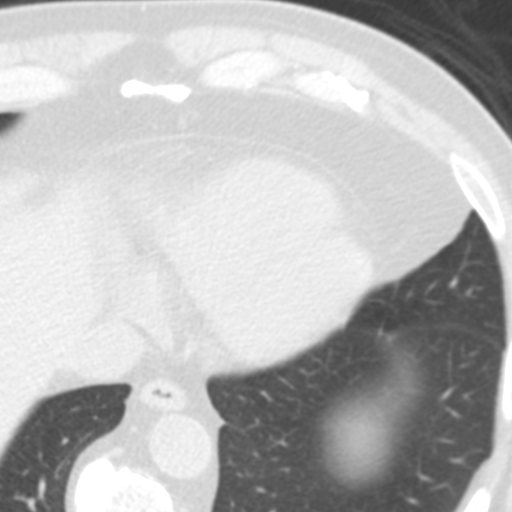
[im 21/84  vessel]
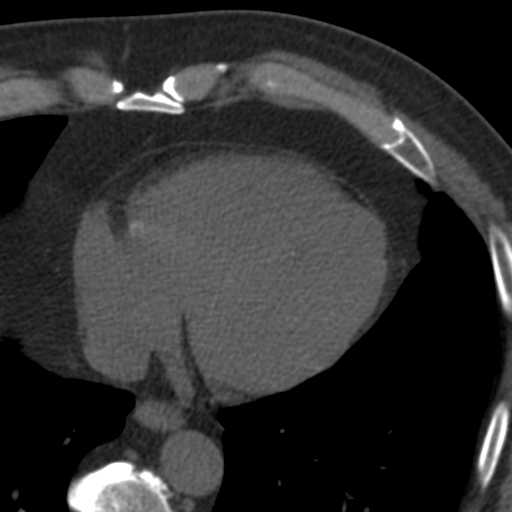
[im 32/84  vessel]
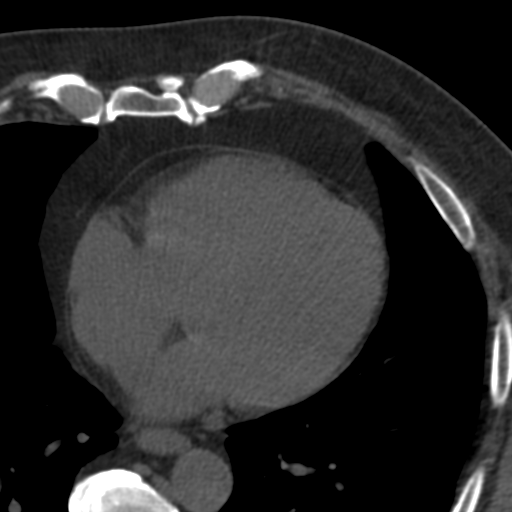
[im 42/84  vessel]
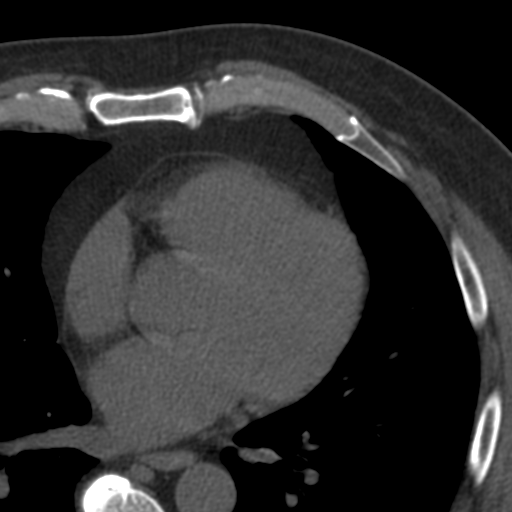
[im 52/84  vessel]
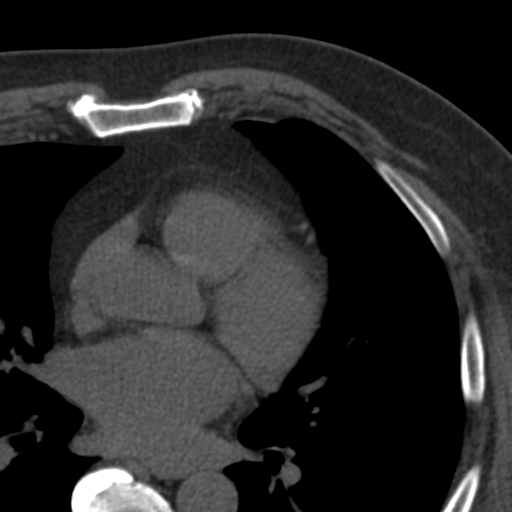
[im 52/84  lung]
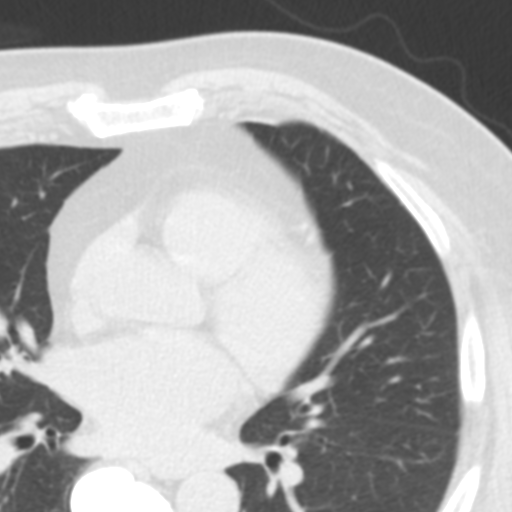
[im 63/84  vessel]
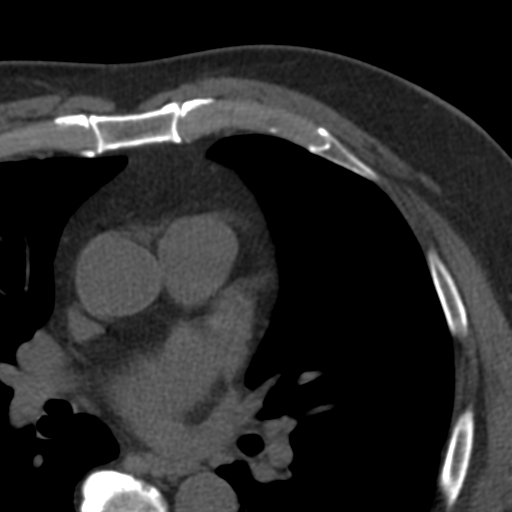
[im 73/84  vessel]
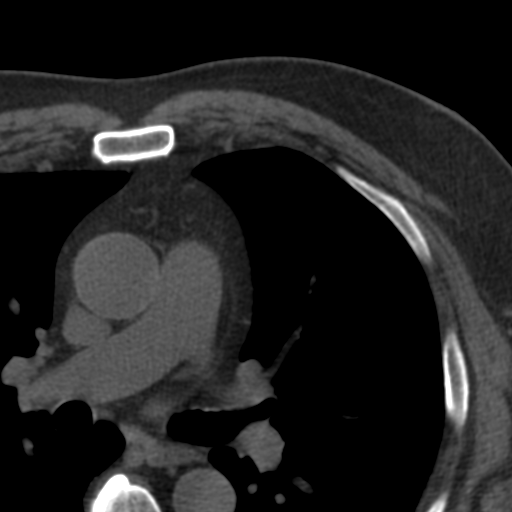

[Series 4: calcium scoring 1.50 br60 bestdiast 66% lung · axial · 0.67mm/px · z∈[+1878,+2004]mm · 8 of 106 slices shown]
[im 11/106  lung]
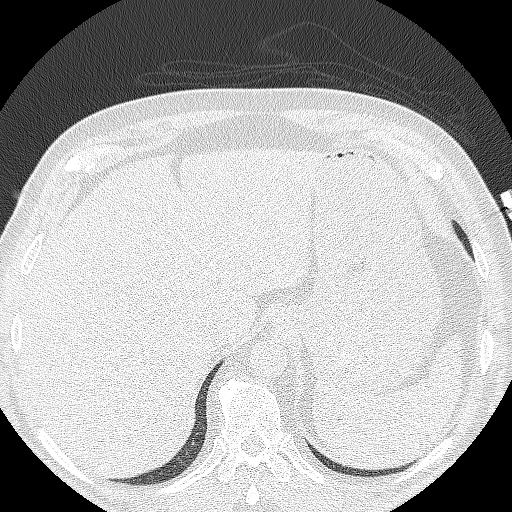
[im 22/106  lung]
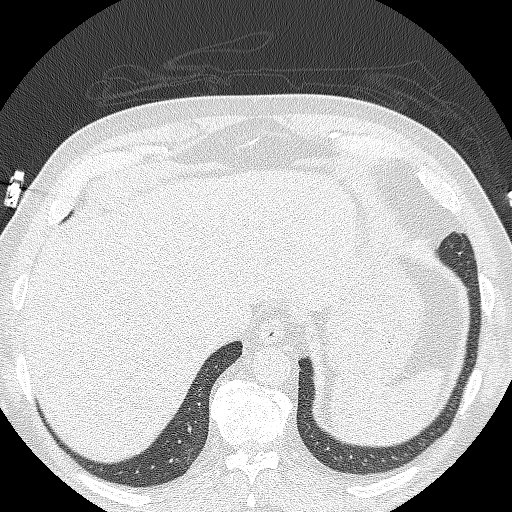
[im 32/106  lung]
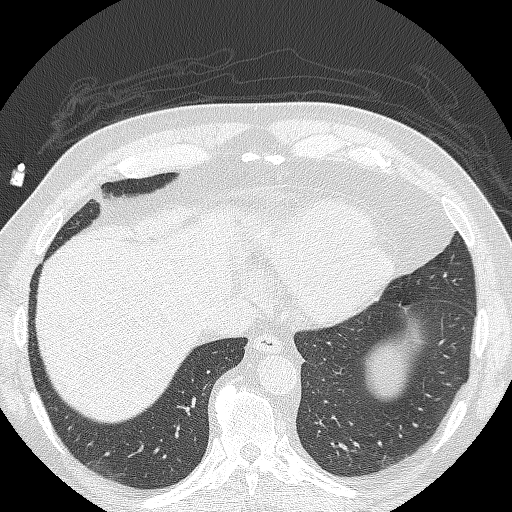
[im 43/106  lung]
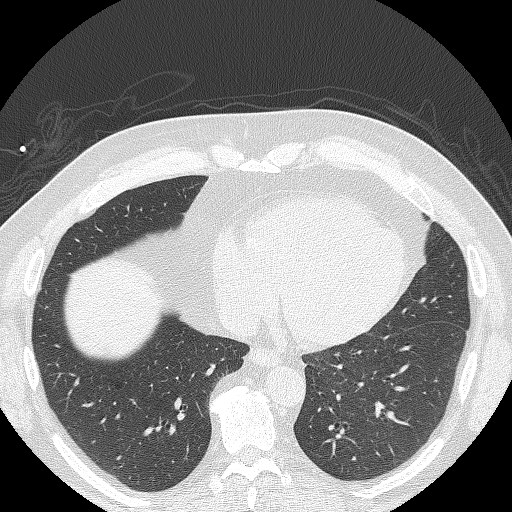
[im 64/106  lung]
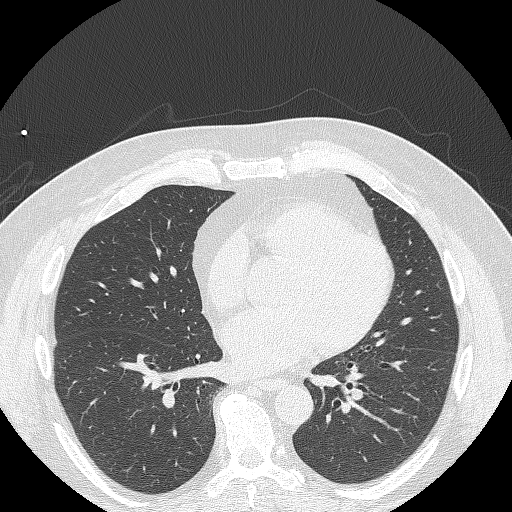
[im 74/106  lung]
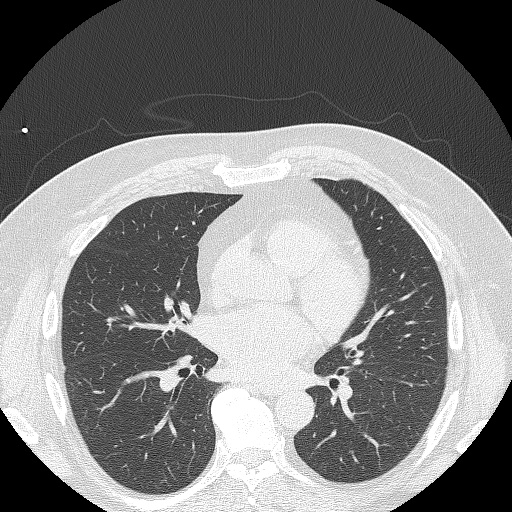
[im 85/106  lung]
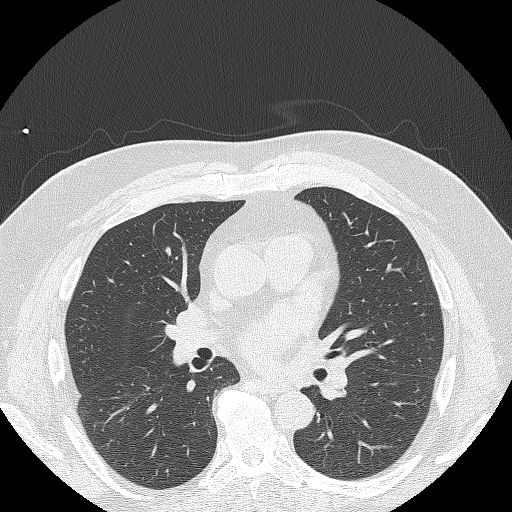
[im 95/106  lung]
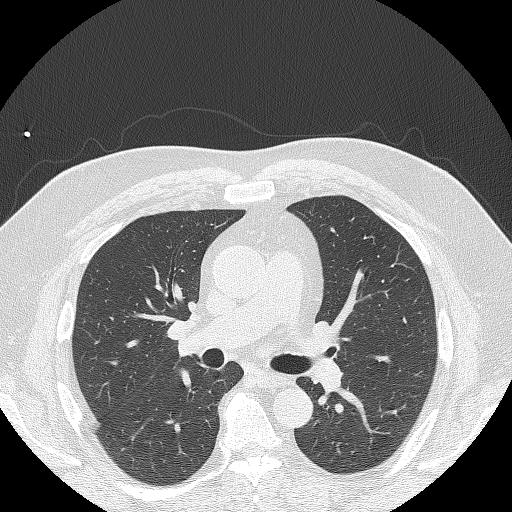

[15 of 20 positions shown; findings below may reference images not displayed]

DIAGNOSTIC STUDIES

EXAM

CT cardiac calcium scoring:

INDICATION

screening for coronary artery disease
Screening. ME

TECHNIQUE

Contiguous axial tomographic images were obtained from the lung apices to the upper abdomen without
intravenous contrast. Coronal and sagittal reformatted images were obtained.

All CT scans at this facility use dose modulation, iterative reconstruction, and/or weight based
dosing when appropriate to reduce radiation dose to as low as reasonably achievable.

0 CT and 0 nuclear scans in the last year.

COMPARISONS

No priors available for comparison.

FINDINGS

The calcium score is 17, which places the patient in the 40th percentile.

IMPRESSION

Moderate cardiovascular disease risk.

Tech Notes:

Screening. ME

## 2020-12-08 ENCOUNTER — Encounter: Admit: 2020-12-08 | Discharge: 2020-12-08 | Payer: BC Managed Care – HMO

## 2020-12-10 IMAGING — CR [ID]
3 series · 3 of 3 positions shown · non-contrast
Comparison: none

[knee ap]
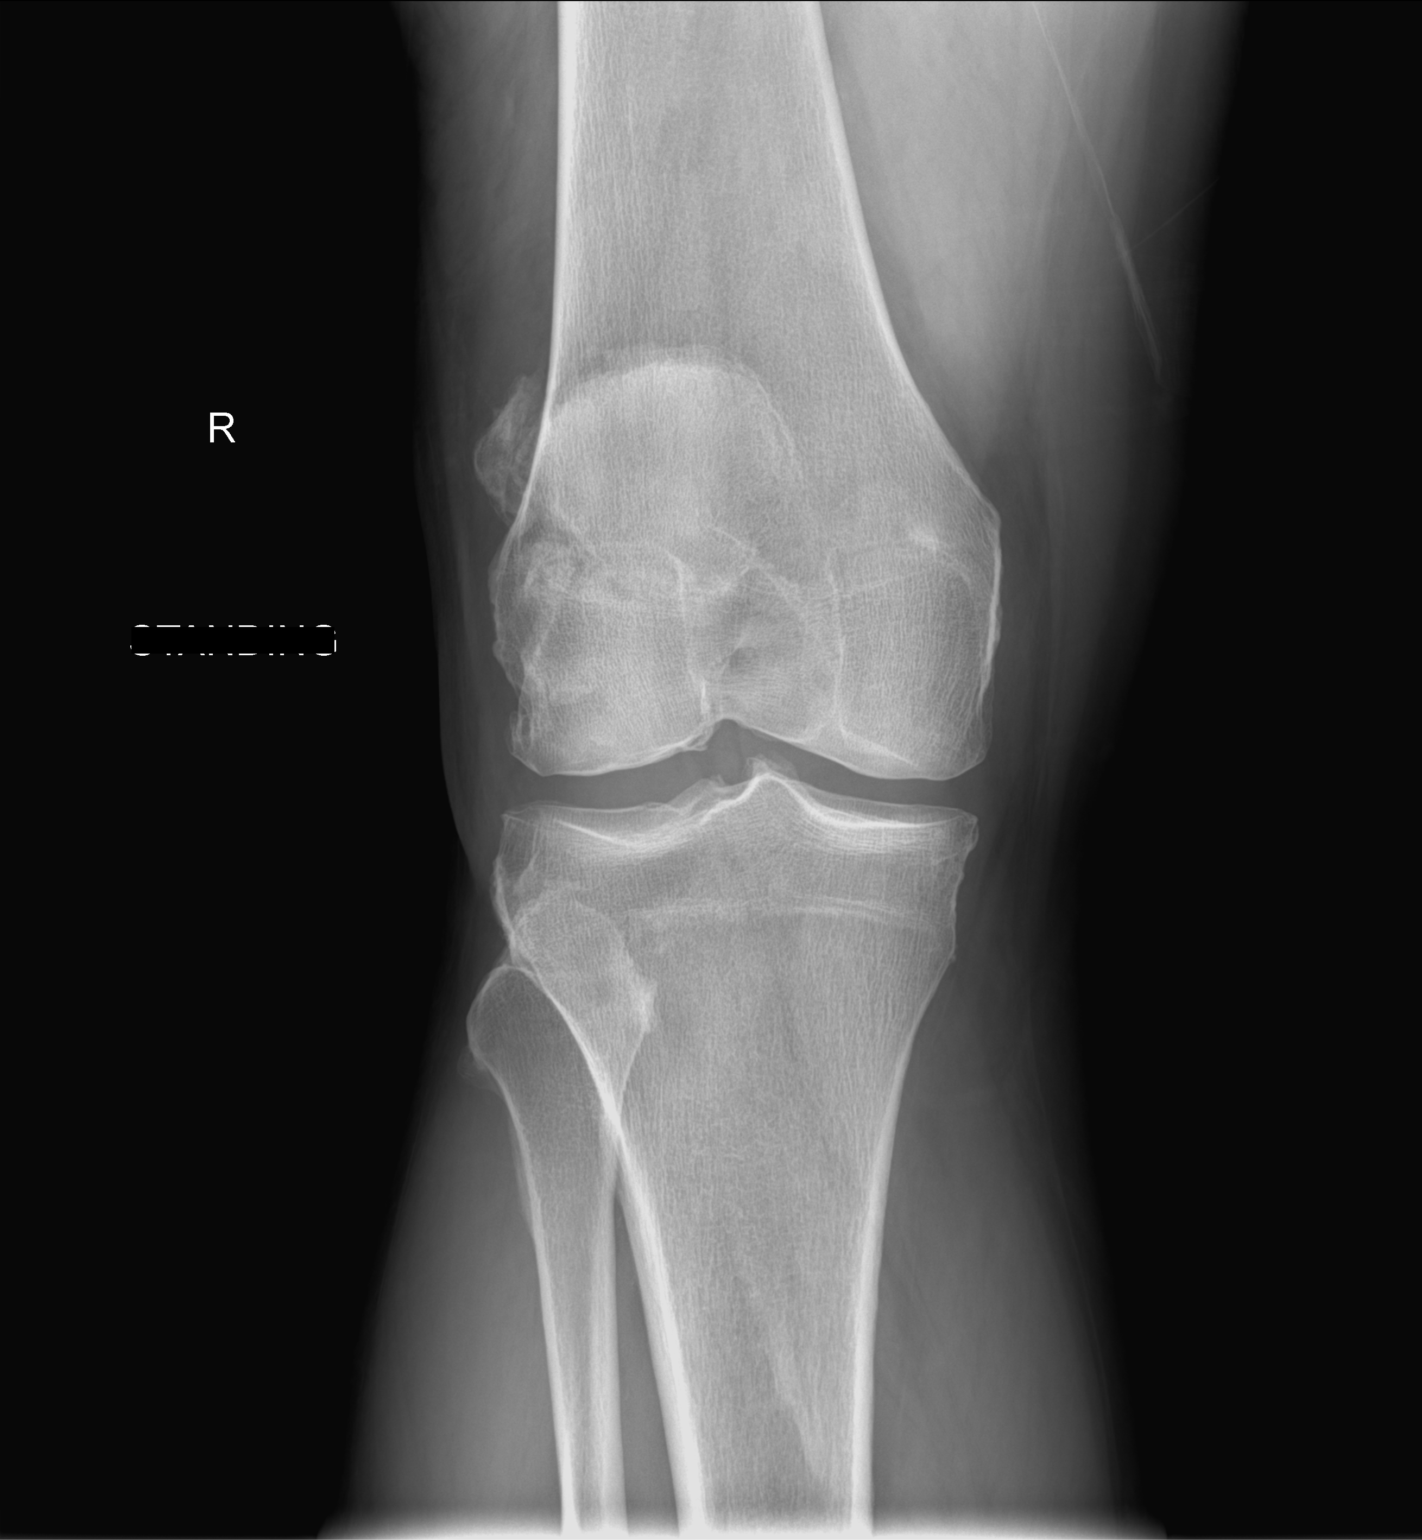

[knee sunrise]
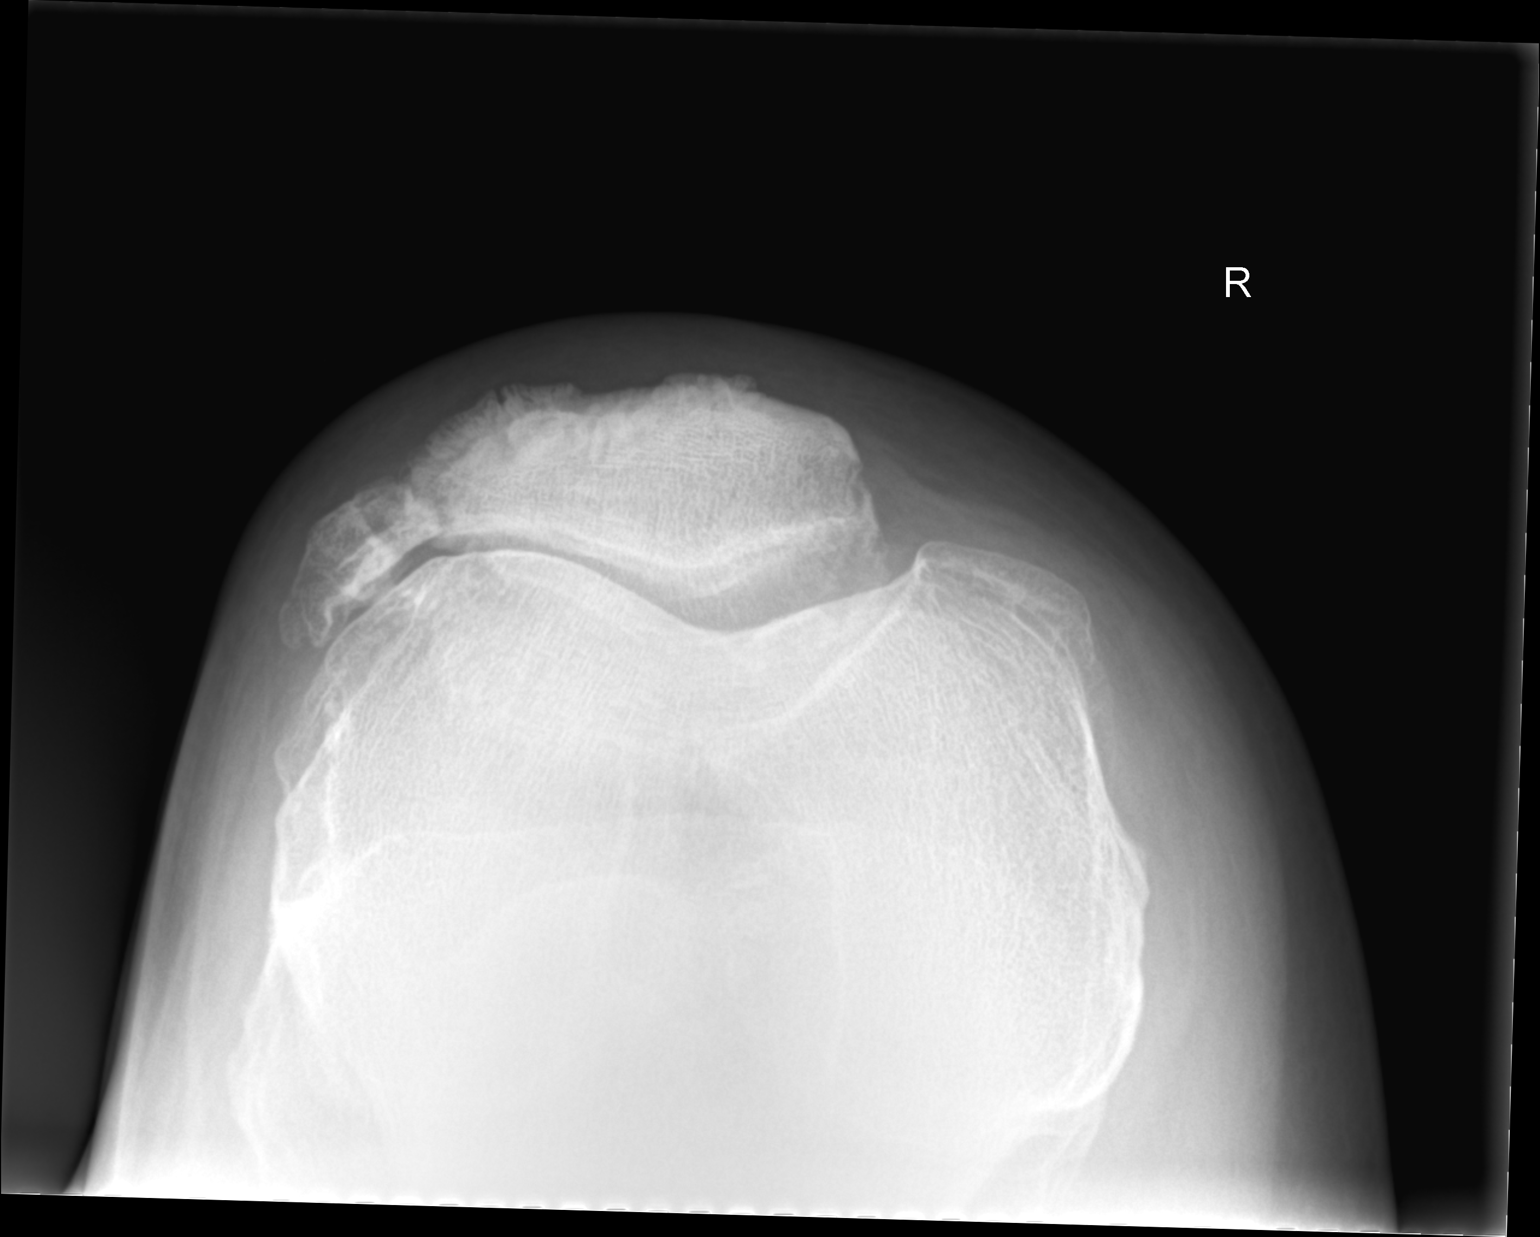

[knee lat]
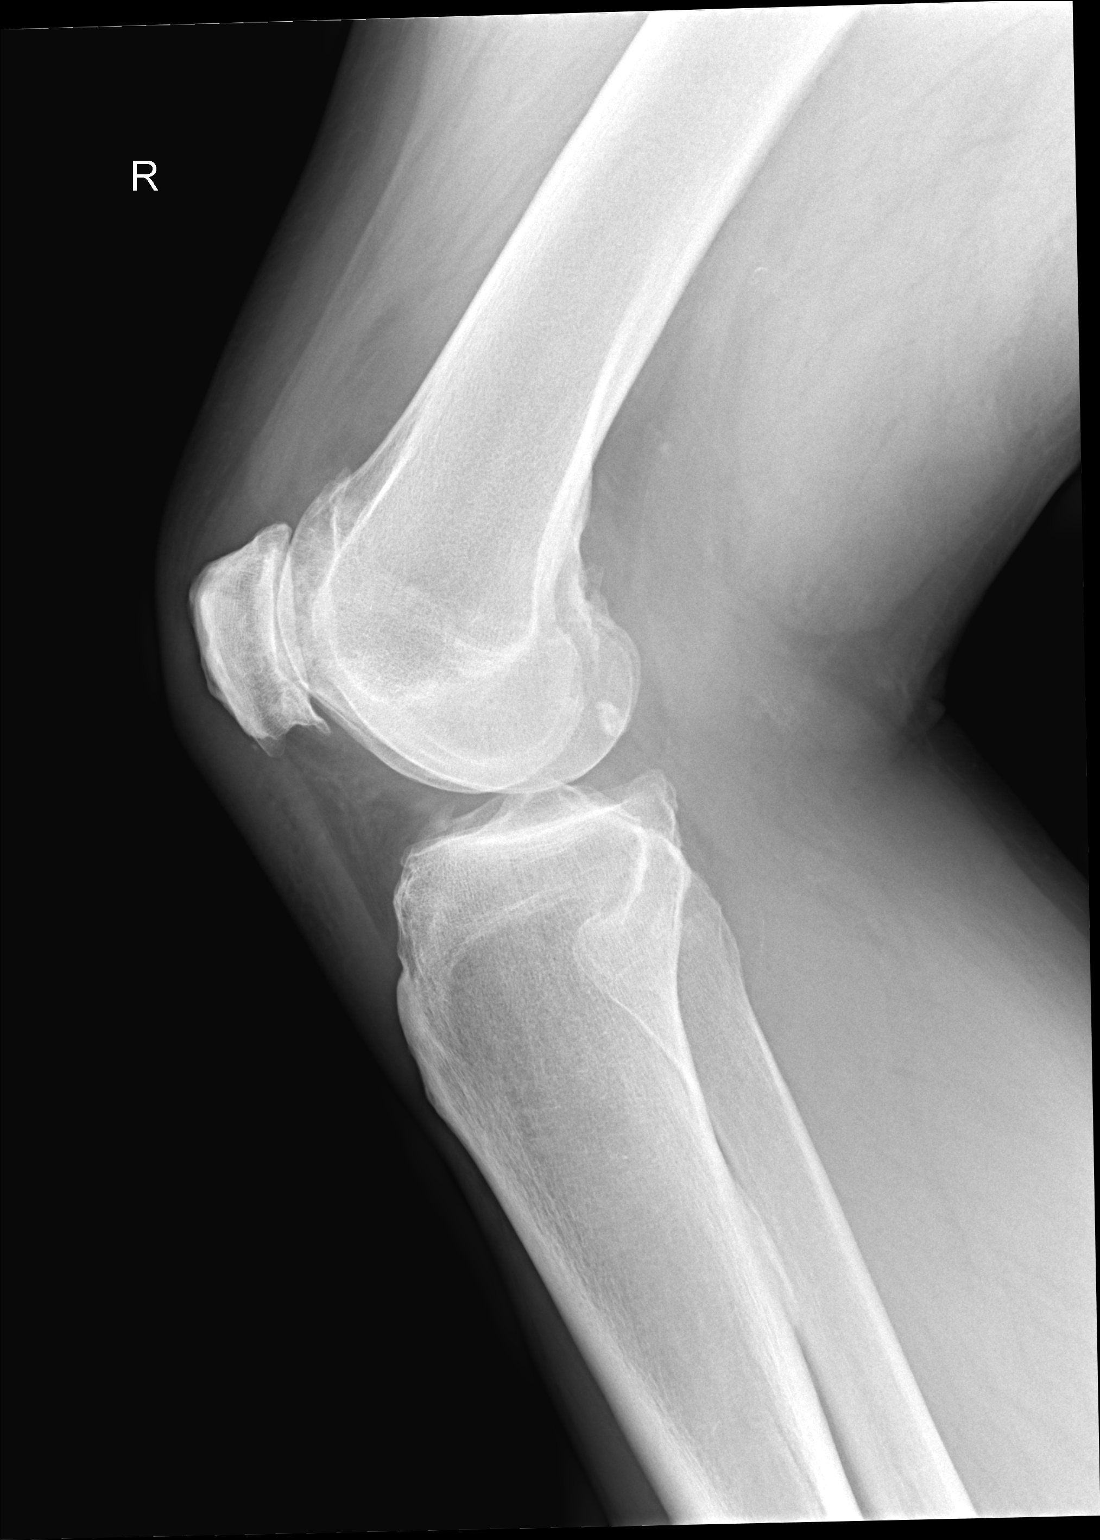

[3 of 3 positions shown; findings below may reference images not displayed]

DIAGNOSTIC STUDIES

EXAM

RADIOLOGICAL EXAMINATION, KNEE RIGHT; 3 VIEWS CPT 94564

INDICATION

Pain.

COMPARISONS

No priors available for comparison.

FINDINGS

No acute fracture or dislocation. Trace effusion. Bipartite patella. Severe loss of joint space
with subchondral sclerosis within the patellofemoral compartment.

IMPRESSION

1. Severe osteoarthritic changes within the patellofemoral compartment and trace joint effusion.

Follow up with an orthopedic consultation can be performed.

Tech Notes:

rt knee pain and instability x3 days; pt states he heard a pop about 3 months ago; no known injury

## 2020-12-14 IMAGING — MR Knee^Routine
6 series · 40 of 40 positions shown · non-contrast
Comparison: none

[Series 3: T2 fat-sat · axial · 4.0mm · 0.50mm/px · z∈[-55,+50]mm · 6 of 25 slices shown (1 of 4)]
[im 1/25]
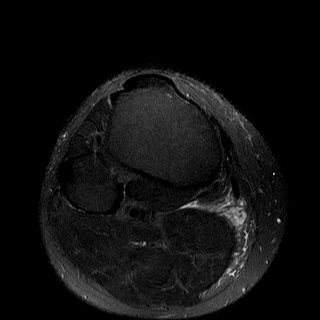
[im 5/25]
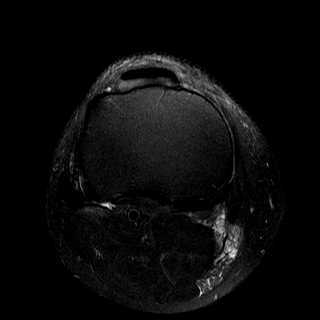
[im 10/25]
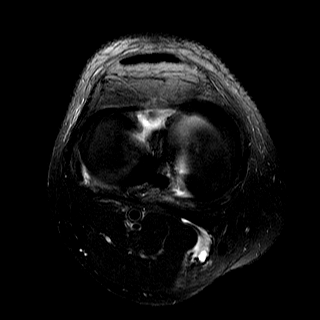
[im 15/25]
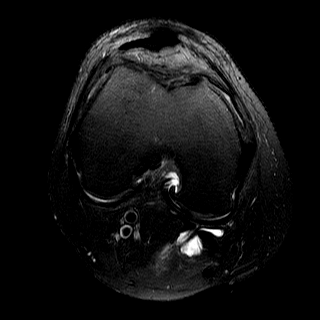
[im 20/25]
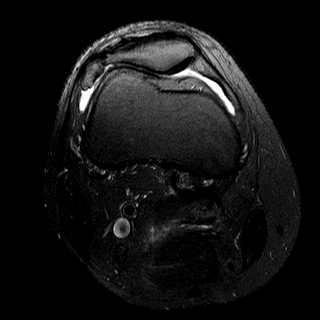
[im 25/25]
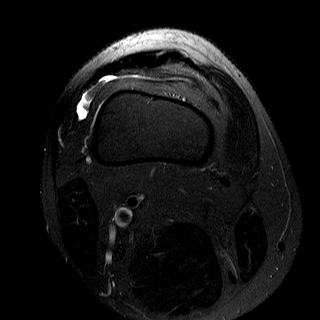

[Series 4: T1 · axial · 4.0mm · 0.62mm/px · z∈[-55,+50]mm · 6 of 25 slices shown]
[im 1/25]
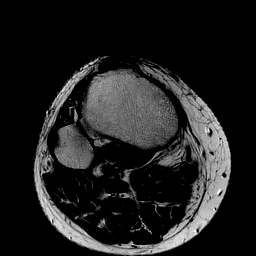
[im 5/25]
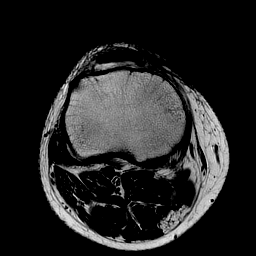
[im 10/25]
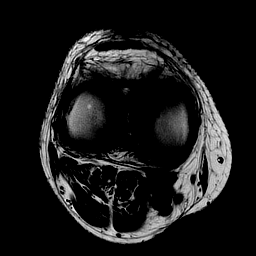
[im 15/25]
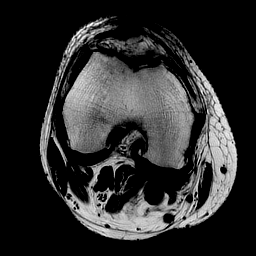
[im 20/25]
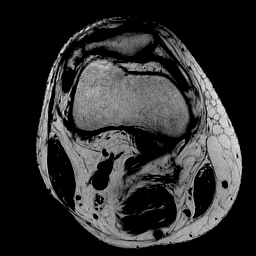
[im 25/25]
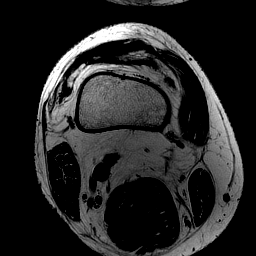

[Series 5: T2 fat-sat · coronal · 3.5mm · 0.50mm/px · 7 of 28 slices shown (2 of 4)]
[im 1/28]
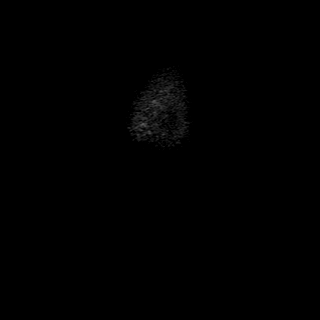
[im 5/28]
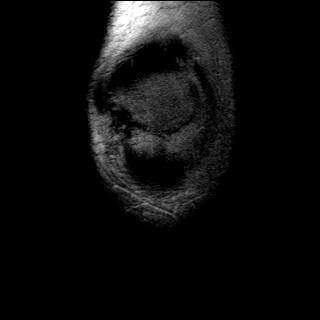
[im 10/28]
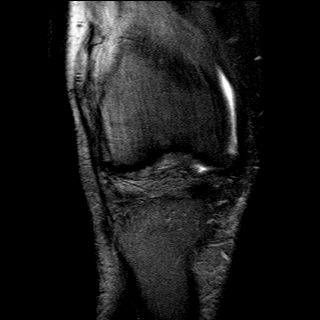
[im 14/28]
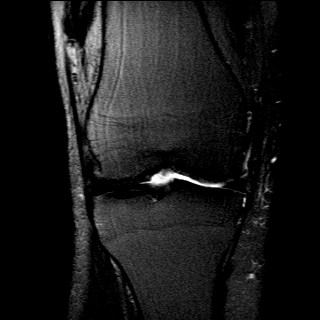
[im 19/28]
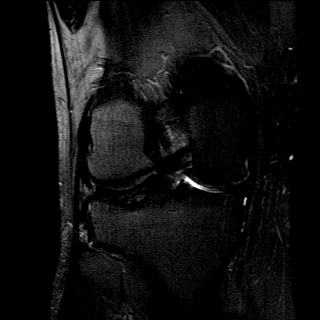
[im 23/28]
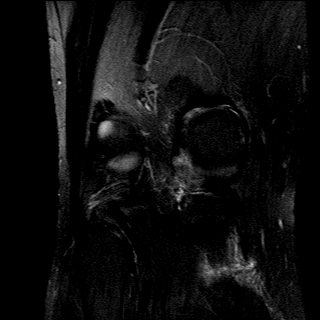
[im 28/28]
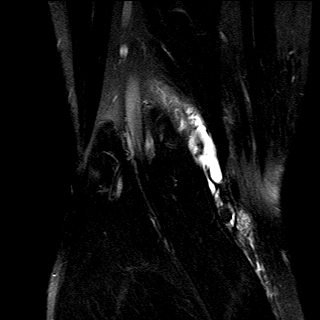

[Series 6: T2 fat-sat · sagittal · 4.0mm · 0.50mm/px · 7 of 27 slices shown (3 of 4)]
[im 1/27]
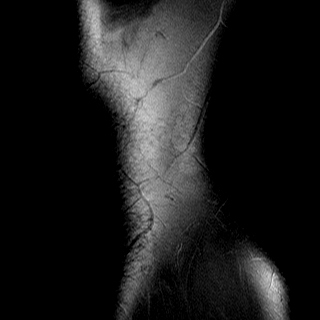
[im 5/27]
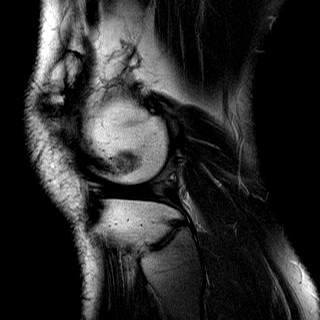
[im 9/27]
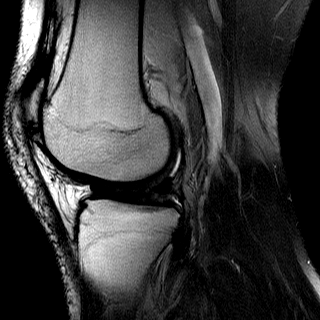
[im 14/27]
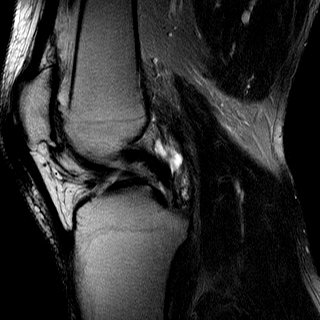
[im 18/27]
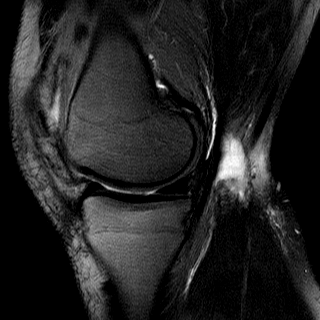
[im 22/27]
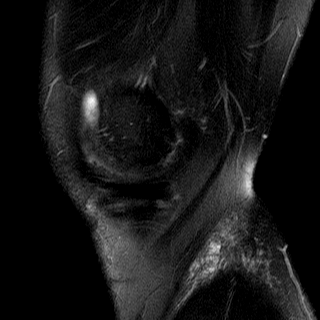
[im 27/27]
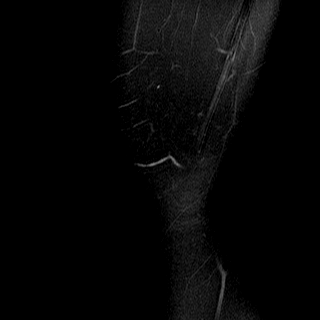

[Series 7: PD · sagittal · 4.0mm · 0.29mm/px · 7 of 27 slices shown]
[im 1/27]
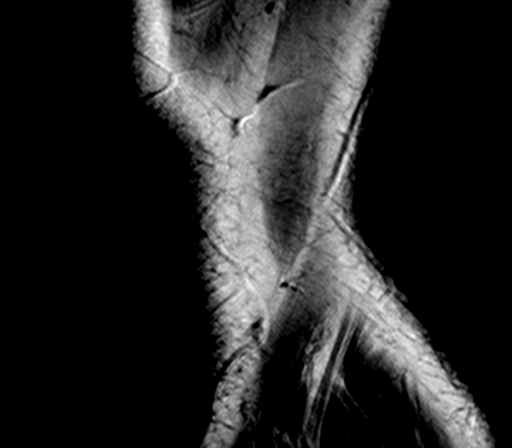
[im 5/27]
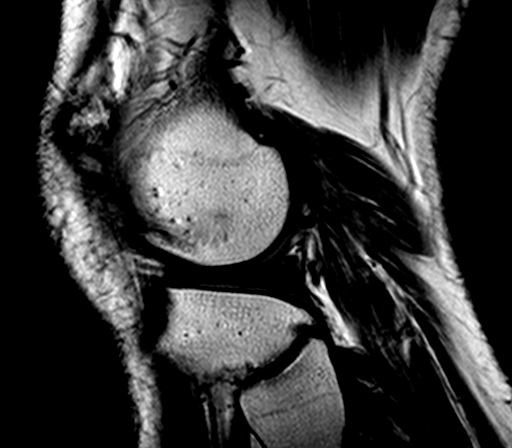
[im 9/27]
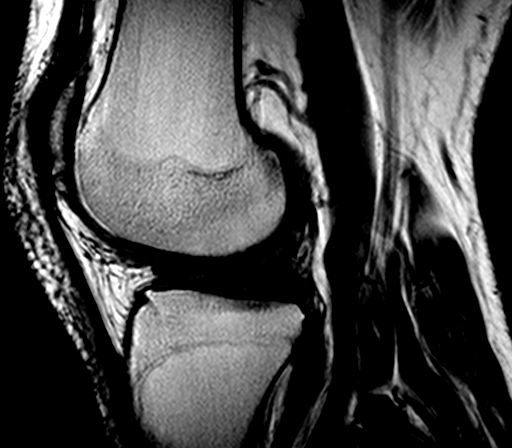
[im 14/27]
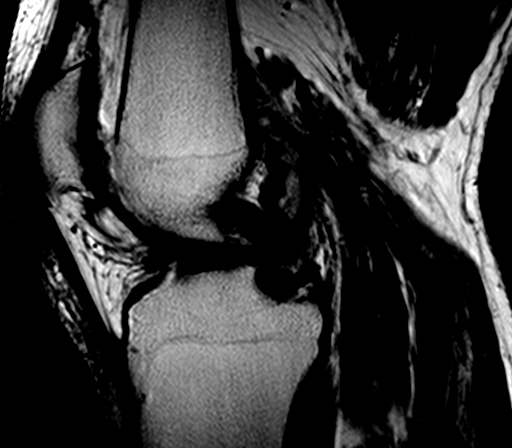
[im 18/27]
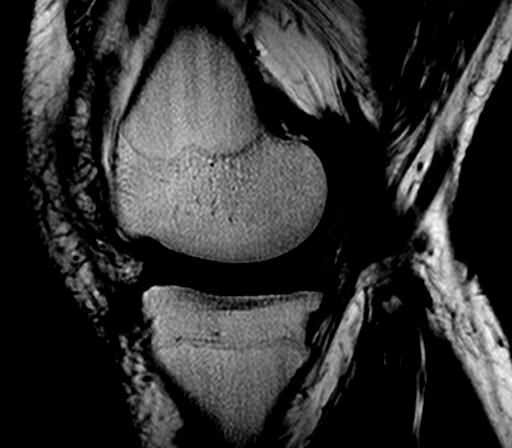
[im 22/27]
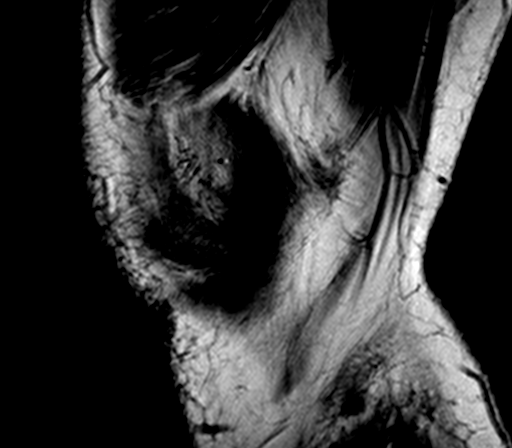
[im 27/27]
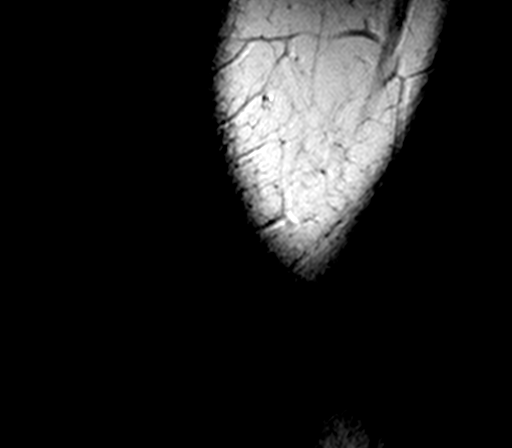

[Series 8: T2 fat-sat · coronal · 3.5mm · 0.50mm/px · 7 of 28 slices shown (4 of 4)]
[im 1/28]
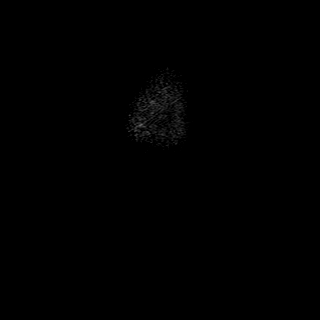
[im 5/28]
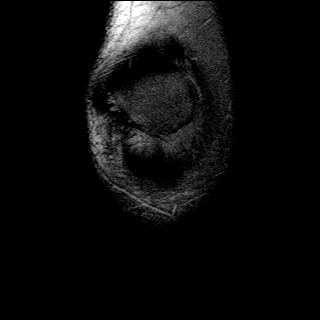
[im 10/28]
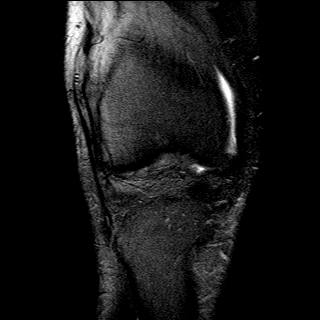
[im 14/28]
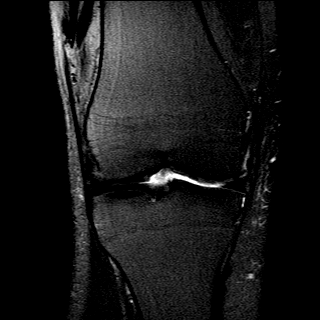
[im 19/28]
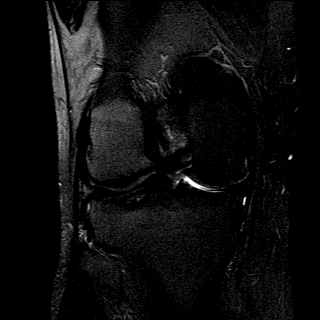
[im 23/28]
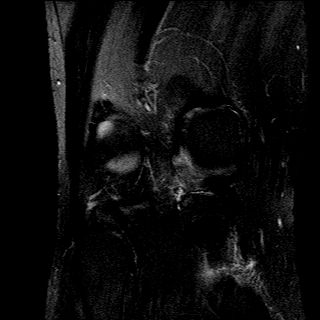
[im 28/28]
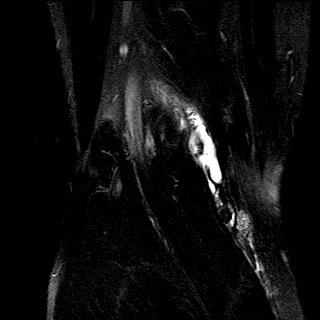

[40 of 40 positions shown; findings below may reference images not displayed]

DIAGNOSTIC STUDIES

EXAM

MRI of the right knee without contrast.

INDICATION

Right knee pain with instability
RT POSTERIOR KNEE PAIN AFTER HEARING A POP. RG

TECHNIQUE

Sagittal axial images were obtained with variable T1 and T2 weighting.

COMPARISONS

None available

FINDINGS

Quadriceps and patellar tendons are normal. There is advanced chondromalacia patella with extensive
loss of articular cartilage. Small knee effusion is noted. There is a small hypo intense filling
defect within the lateral suprapatellar bursa measuring 9 millimeters maximally image 22 series 3
likely indicating synovial osteochondromatosis or other loose body.

Complex appearing popliteal cyst measures 2.4 cm craniocaudad image 17 series 6. Adjacent soft
tissue edema may indicate cyst rupture.

The anterior and posterior cruciate ligaments are normal.

The medial and lateral collateral ligaments are normal.

Blunting of the free edge of the posterior horn and body of the medial meniscus is noted consistent
with small tear.

The lateral meniscus is unremarkable.

Visualized osseous structures demonstrate normal signal intensity.

IMPRESSION

Small tear of the free edge at the junction of the posterior horn body of the medial meniscus.

Small popliteal cyst with adjacent edema which could indicate cyst rupture.

Advanced chondromalacia patella and small knee effusion. Possible small focus of
osteochondromatosis or calcified loose body is noted along the lateral knee within the knee
effusion.

Tech Notes:

RT POSTERIOR KNEE PAIN AFTER HEARING A POP. RG

## 2021-05-24 ENCOUNTER — Encounter: Admit: 2021-05-24 | Discharge: 2021-05-24 | Payer: BC Managed Care – HMO

## 2021-05-25 ENCOUNTER — Encounter: Admit: 2021-05-25 | Discharge: 2021-05-25 | Payer: BC Managed Care – HMO

## 2021-05-25 ENCOUNTER — Ambulatory Visit: Admit: 2021-05-25 | Discharge: 2021-05-25 | Payer: BC Managed Care – HMO

## 2021-05-25 DIAGNOSIS — Z9981 Dependence on supplemental oxygen: Secondary | ICD-10-CM

## 2021-05-25 DIAGNOSIS — S4991XA Unspecified injury of right shoulder and upper arm, initial encounter: Principal | ICD-10-CM

## 2021-05-25 DIAGNOSIS — E079 Disorder of thyroid, unspecified: Secondary | ICD-10-CM

## 2021-05-25 DIAGNOSIS — I10 Essential (primary) hypertension: Secondary | ICD-10-CM

## 2021-06-06 ENCOUNTER — Encounter: Admit: 2021-06-06 | Discharge: 2021-06-06 | Payer: BC Managed Care – HMO

## 2021-06-06 IMAGING — MR Shoulder^ROUTINE
4 of 5 series · 20 of 40 positions shown · non-contrast
Comparison: none

[Series 4: T2 fat-sat · sagittal · 4.0mm · 0.27mm/px · 6 of 18 slices shown (1 of 2)]
[im 1/18]
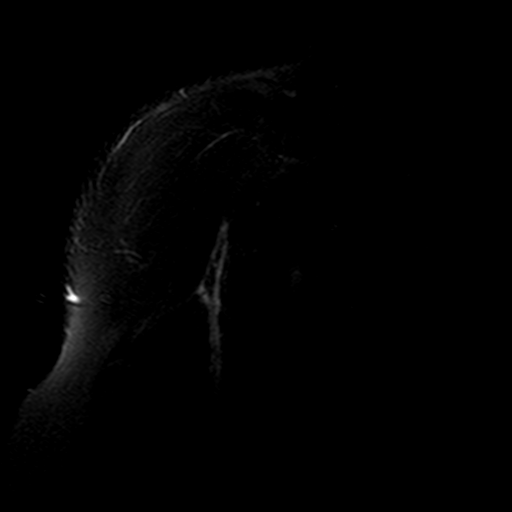
[im 4/18]
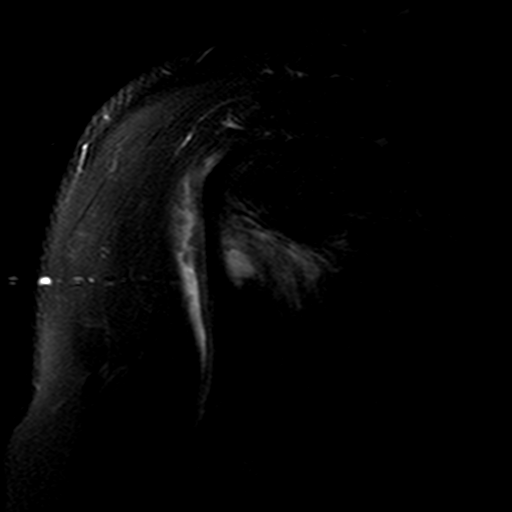
[im 7/18]
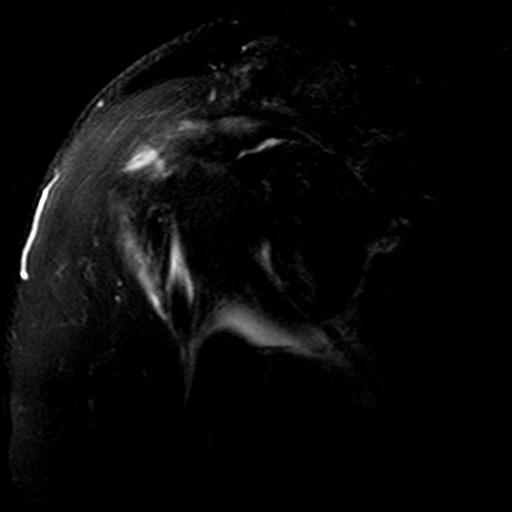
[im 11/18]
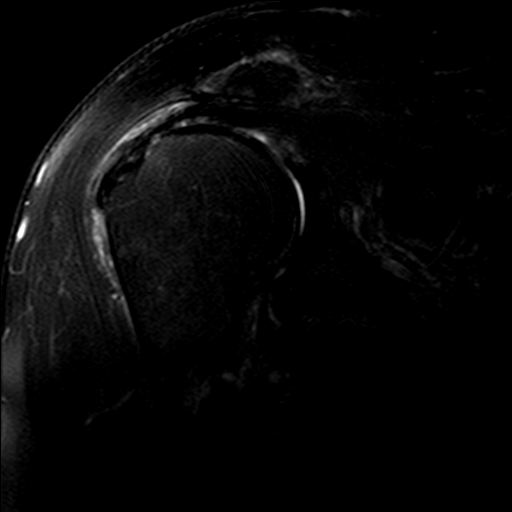
[im 14/18]
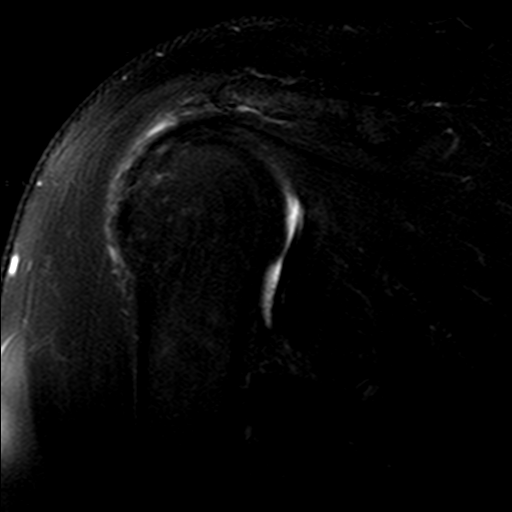
[im 18/18]
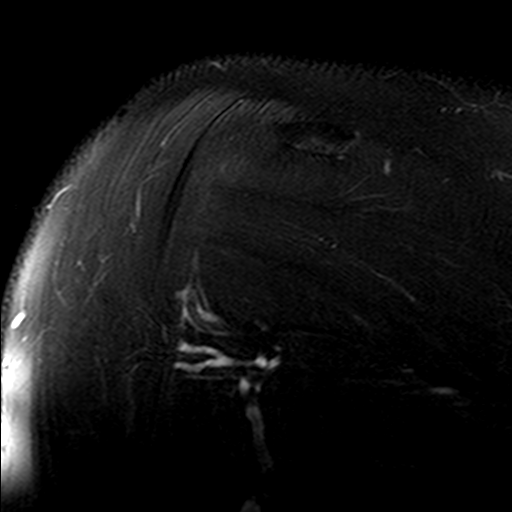

[Series 5: T2 fat-sat · axial · 4.0mm · 0.35mm/px · z∈[-47,+44]mm · 8 of 23 slices shown (2 of 2)]
[im 1/23]
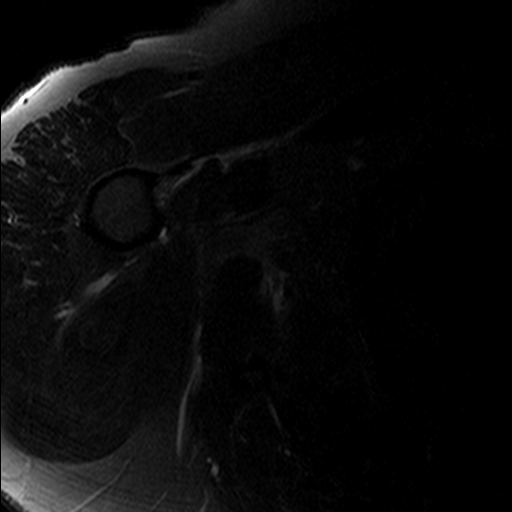
[im 3/23]
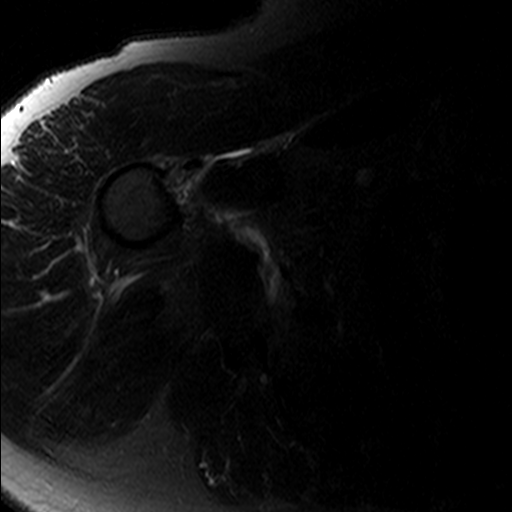
[im 6/23]
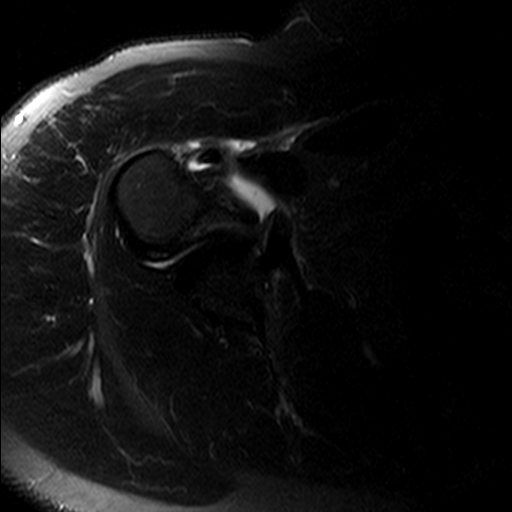
[im 9/23]
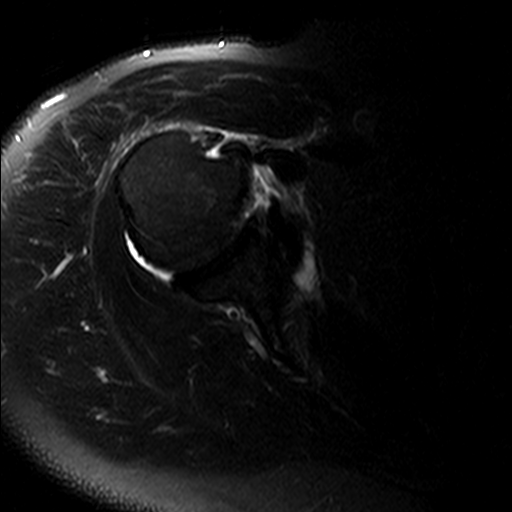
[im 12/23]
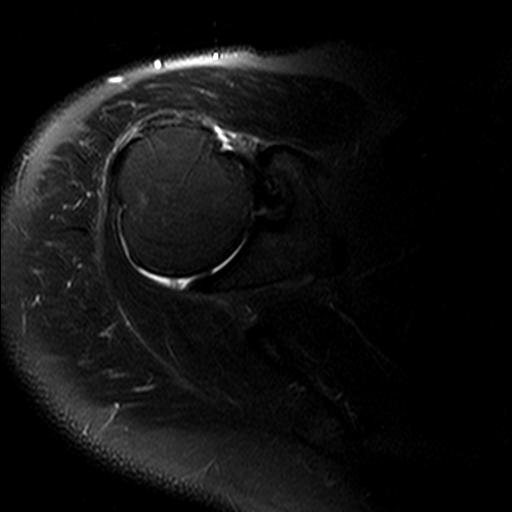
[im 14/23]
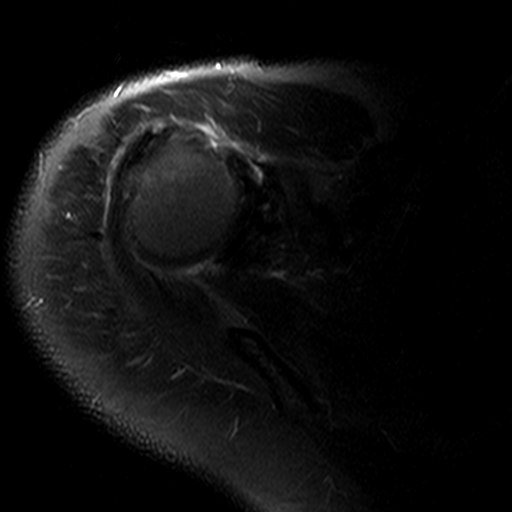
[im 17/23]
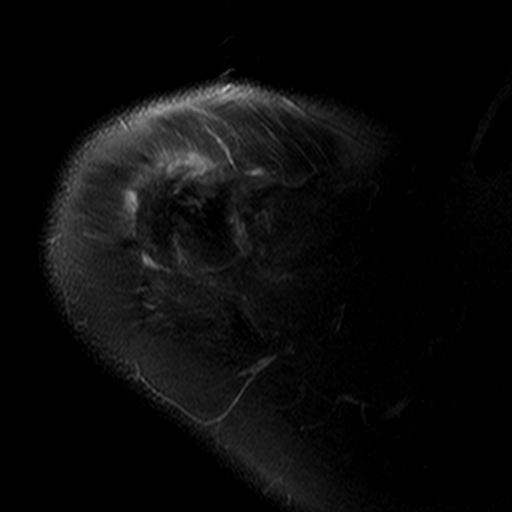
[im 20/23]
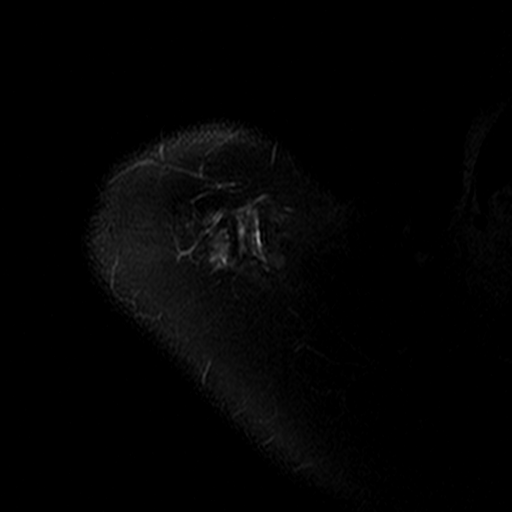

[Series 6: T1 · coronal · 4.0mm · 0.44mm/px · 3 of 22 slices shown]
[im 3/22]
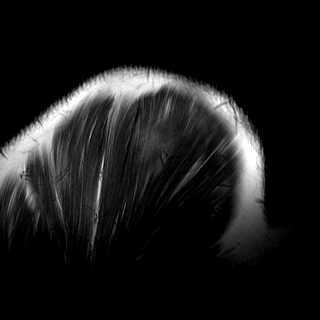
[im 11/22]
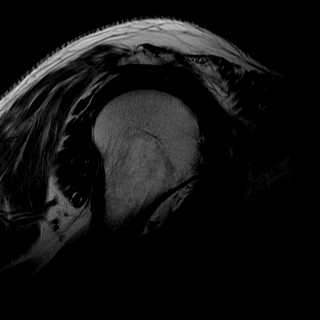
[im 19/22]
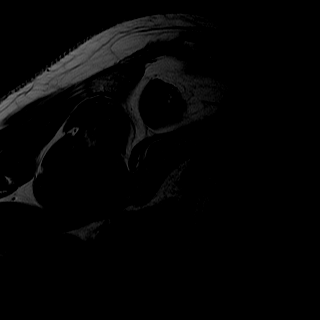

[Series 7: STIR · coronal · 4.0mm · 0.27mm/px · 3 of 22 slices shown]
[im 3/22]
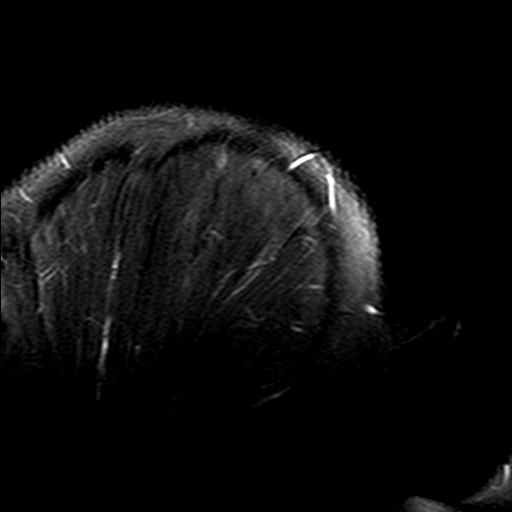
[im 11/22]
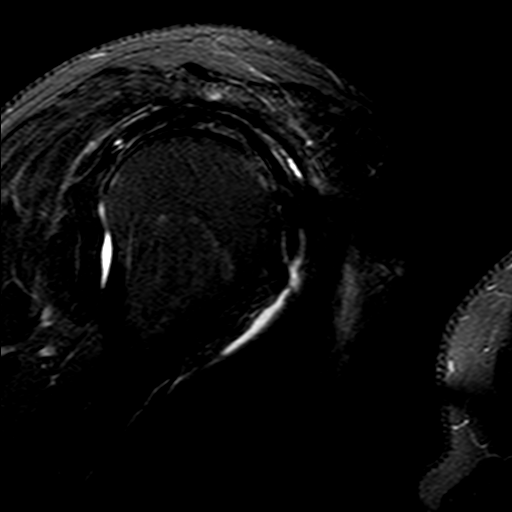
[im 19/22]
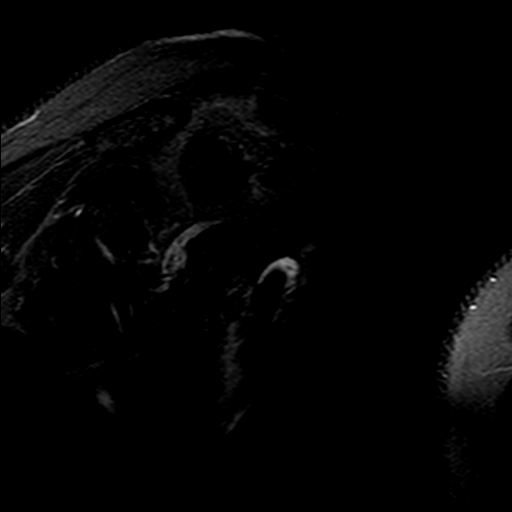

[20 of 40 positions shown; findings below may reference images not displayed]

DIAGNOSTIC STUDIES

EXAM

MRI right shoulder without contrast.

INDICATION

shoulder pain
FALL DOWN 14 STEPS 1 MONTH AGO WITH CONTINUED PAIN TO RT SHOULDER, VERY LIMITED ROM, UNABLE TO LIFT
ARM. RG

TECHNIQUE

Oblique coronal, oblique sagittal, and axial images were obtained with variable T1 and T2
weighting.

COMPARISONS

None available

FINDINGS

There is a full-thickness tendon tear of the rotator cuff tendon anteriorly and distally measuring
approximately 9 millimeters. The remaining tendon is irregular in appearance demonstrating both art
icular surface and bursal surface tears for instance image 10 series 8. Underlying
tendinosis/tendinopathy of the rotator cuff tendon is also evident.

The bicipital tendon demonstrates mild medial subluxation. Image 14 series 5. The sub scapularis
tendon is within normal limits. There is edema along the fibers of the sub scapularis suggesting
muscular strain.

Degenerative changes of the AC joint are noted

Mild degenerative changes of the glenohumeral joint space are also noted.

IMPRESSION

Small full-thickness tear of the anterior distal rotator cuff tendon. The remaining tendon is
irregular demonstrating both articular and bursal surface tears and underlying tendinosis/te
ndinopathy.

Slight medial subluxation the proximal bicipital tendon.

Degenerative changes of the AC joint and small shoulder effusion.

Edema within the fibers of the sub scapularis consistent with muscular strain.

Tech Notes:

FALL DOWN 14 STEPS 1 MONTH AGO WITH CONTINUED PAIN TO RT SHOULDER, VERY LIMITED ROM, UNABLE TO LIFT
ARM.  RG

## 2021-06-06 NOTE — Telephone Encounter
Received voicemail from pt's wife, Salar Molden, requesting to proceed with scheduling surgery if needed per MRI results as she would like pt to have surgery prior to the end of the year. RN returned call, she did not answer. Lvm to return call to ext [phone].

## 2021-06-15 ENCOUNTER — Ambulatory Visit: Admit: 2021-06-15 | Discharge: 2021-06-15 | Payer: BC Managed Care – HMO

## 2021-06-15 ENCOUNTER — Encounter: Admit: 2021-06-15 | Discharge: 2021-06-15 | Payer: BC Managed Care – HMO

## 2021-06-15 DIAGNOSIS — S46011A Strain of muscle(s) and tendon(s) of the rotator cuff of right shoulder, initial encounter: Secondary | ICD-10-CM

## 2021-06-15 DIAGNOSIS — I10 Essential (primary) hypertension: Secondary | ICD-10-CM

## 2021-06-15 DIAGNOSIS — Z9981 Dependence on supplemental oxygen: Secondary | ICD-10-CM

## 2021-06-15 DIAGNOSIS — E079 Disorder of thyroid, unspecified: Secondary | ICD-10-CM

## 2021-06-16 ENCOUNTER — Encounter: Admit: 2021-06-16 | Discharge: 2021-06-16 | Payer: BC Managed Care – HMO

## 2021-06-16 DIAGNOSIS — I10 Essential (primary) hypertension: Secondary | ICD-10-CM

## 2021-06-16 DIAGNOSIS — Z9981 Dependence on supplemental oxygen: Secondary | ICD-10-CM

## 2021-06-16 DIAGNOSIS — E079 Disorder of thyroid, unspecified: Secondary | ICD-10-CM

## 2021-06-16 DIAGNOSIS — E785 Hyperlipidemia, unspecified: Secondary | ICD-10-CM

## 2021-06-29 ENCOUNTER — Encounter: Admit: 2021-06-29 | Discharge: 2021-06-29 | Payer: BC Managed Care – HMO

## 2021-06-30 ENCOUNTER — Encounter: Admit: 2021-06-30 | Discharge: 2021-06-30 | Payer: BC Managed Care – HMO

## 2021-06-30 ENCOUNTER — Ambulatory Visit: Admit: 2021-06-30 | Discharge: 2021-06-30 | Payer: BC Managed Care – HMO

## 2021-06-30 DIAGNOSIS — E785 Hyperlipidemia, unspecified: Secondary | ICD-10-CM

## 2021-06-30 DIAGNOSIS — Z9981 Dependence on supplemental oxygen: Secondary | ICD-10-CM

## 2021-06-30 DIAGNOSIS — E079 Disorder of thyroid, unspecified: Secondary | ICD-10-CM

## 2021-06-30 DIAGNOSIS — I10 Essential (primary) hypertension: Secondary | ICD-10-CM

## 2021-06-30 MED ORDER — FENTANYL CITRATE (PF) 50 MCG/ML IJ SOLN
INTRAVENOUS | 0 refills | Status: CP
Start: 2021-06-30 — End: ?
  Administered 2021-06-30 (×3): 50 ug via INTRAVENOUS

## 2021-06-30 MED ORDER — PHENYLEPHRINE 40 MCG/ML IN NS IV DRIP (STD CONC)
INTRAVENOUS | 0 refills | Status: DC
Start: 2021-06-30 — End: 2021-06-30
  Administered 2021-06-30 (×2): .2 ug/kg/min via INTRAVENOUS

## 2021-06-30 MED ORDER — DEXAMETHASONE SODIUM PHOSPHATE 4 MG/ML IJ SOLN
INTRAVENOUS | 0 refills | Status: DC
Start: 2021-06-30 — End: 2021-06-30
  Administered 2021-06-30: 15:00:00 4 mg via INTRAVENOUS

## 2021-06-30 MED ORDER — LIDOCAINE (PF) 10 MG/ML (1 %) IJ SOLN
SUBCUTANEOUS | 0 refills | Status: CP
Start: 2021-06-30 — End: ?
  Administered 2021-06-30: 14:00:00 3 mL via SUBCUTANEOUS

## 2021-06-30 MED ORDER — LACTATED RINGERS IV SOLP
INTRAVENOUS | 0 refills | Status: DC
Start: 2021-06-30 — End: 2021-06-30
  Administered 2021-06-30: 16:00:00 via INTRAVENOUS

## 2021-06-30 MED ORDER — OXYCODONE 5 MG PO TAB
ORAL_TABLET | 0 refills | PRN
Start: 2021-06-30 — End: ?

## 2021-06-30 MED ORDER — CEFAZOLIN 1 GRAM IJ SOLR
INTRAVENOUS | 0 refills | Status: DC
Start: 2021-06-30 — End: 2021-06-30
  Administered 2021-06-30: 15:00:00 2 g via INTRAVENOUS

## 2021-06-30 MED ORDER — ARTIFICIAL TEARS (PF) SINGLE DOSE DROPS GROUP
OPHTHALMIC | 0 refills | Status: DC
Start: 2021-06-30 — End: 2021-06-30
  Administered 2021-06-30: 15:00:00 2 [drp] via OPHTHALMIC

## 2021-06-30 MED ORDER — LACTATED RINGERS IV SOLP
INTRAVENOUS | 0 refills | Status: DC
Start: 2021-06-30 — End: 2021-06-30

## 2021-06-30 MED ORDER — ONDANSETRON HCL (PF) 4 MG/2 ML IJ SOLN
INTRAVENOUS | 0 refills | Status: DC
Start: 2021-06-30 — End: 2021-06-30
  Administered 2021-06-30: 15:00:00 4 mg via INTRAVENOUS

## 2021-06-30 MED ORDER — BUPIVACAINE HCL 0.25 % (2.5 MG/ML) IJ SOLN
0 refills | Status: CP
Start: 2021-06-30 — End: ?
  Administered 2021-06-30: 14:00:00 20 mL

## 2021-06-30 MED ORDER — MIDAZOLAM 1 MG/ML IJ SOLN
INTRAVENOUS | 0 refills | Status: CP
Start: 2021-06-30 — End: ?
  Administered 2021-06-30: 14:00:00 2 mg via INTRAVENOUS

## 2021-06-30 MED ORDER — PROPOFOL INJ 10 MG/ML IV VIAL
INTRAVENOUS | 0 refills | Status: DC
Start: 2021-06-30 — End: 2021-06-30
  Administered 2021-06-30: 15:00:00 150 mg via INTRAVENOUS

## 2021-06-30 MED ORDER — LIDOCAINE (PF) 200 MG/10 ML (2 %) IJ SYRG
INTRAVENOUS | 0 refills | Status: DC
Start: 2021-06-30 — End: 2021-06-30
  Administered 2021-06-30: 15:00:00 100 mg via INTRAVENOUS

## 2021-06-30 MED ORDER — SUGAMMADEX 100 MG/ML IV SOLN
INTRAVENOUS | 0 refills | Status: DC
Start: 2021-06-30 — End: 2021-06-30
  Administered 2021-06-30: 17:00:00 230 mg via INTRAVENOUS

## 2021-06-30 MED ORDER — ROCURONIUM 10 MG/ML IV SOLN
INTRAVENOUS | 0 refills | Status: DC
Start: 2021-06-30 — End: 2021-06-30
  Administered 2021-06-30: 15:00:00 20 mg via INTRAVENOUS
  Administered 2021-06-30: 15:00:00 50 mg via INTRAVENOUS
  Administered 2021-06-30: 16:00:00 20 mg via INTRAVENOUS

## 2021-06-30 MED ORDER — DEXAMETHASONE SODIUM PHOSPHATE 10 MG/ML IJ SOLN
0 refills | Status: CP
Start: 2021-06-30 — End: ?
  Administered 2021-06-30: 14:00:00 4 mg

## 2021-06-30 MED ADMIN — SODIUM CHLORIDE 0.9 % IV SOLP [27838]: 1000.000 mL | INTRAVENOUS | @ 13:00:00 | Stop: 2021-06-30 | NDC 00338004904

## 2021-06-30 MED ADMIN — OXYCODONE 5 MG PO TAB [10814]: 10 mg | ORAL | @ 18:00:00 | Stop: 2021-06-30 | NDC 00904696661

## 2021-07-02 ENCOUNTER — Encounter: Admit: 2021-07-02 | Discharge: 2021-07-02 | Payer: BC Managed Care – HMO

## 2021-07-02 DIAGNOSIS — I10 Essential (primary) hypertension: Secondary | ICD-10-CM

## 2021-07-02 DIAGNOSIS — E079 Disorder of thyroid, unspecified: Secondary | ICD-10-CM

## 2021-07-02 DIAGNOSIS — E785 Hyperlipidemia, unspecified: Secondary | ICD-10-CM

## 2021-07-02 DIAGNOSIS — Z9981 Dependence on supplemental oxygen: Secondary | ICD-10-CM

## 2021-07-15 ENCOUNTER — Ambulatory Visit: Admit: 2021-07-15 | Discharge: 2021-07-15 | Payer: BC Managed Care – HMO

## 2021-07-15 ENCOUNTER — Encounter: Admit: 2021-07-15 | Discharge: 2021-07-15 | Payer: BC Managed Care – HMO

## 2021-07-15 DIAGNOSIS — M75101 Unspecified rotator cuff tear or rupture of right shoulder, not specified as traumatic: Secondary | ICD-10-CM

## 2021-07-15 DIAGNOSIS — Z9981 Dependence on supplemental oxygen: Secondary | ICD-10-CM

## 2021-07-15 DIAGNOSIS — I10 Essential (primary) hypertension: Secondary | ICD-10-CM

## 2021-07-15 DIAGNOSIS — S46011A Strain of muscle(s) and tendon(s) of the rotator cuff of right shoulder, initial encounter: Secondary | ICD-10-CM

## 2021-07-15 DIAGNOSIS — E079 Disorder of thyroid, unspecified: Secondary | ICD-10-CM

## 2021-07-15 DIAGNOSIS — E785 Hyperlipidemia, unspecified: Secondary | ICD-10-CM

## 2021-08-12 ENCOUNTER — Encounter: Admit: 2021-08-12 | Discharge: 2021-08-12 | Payer: BC Managed Care – HMO

## 2021-08-12 ENCOUNTER — Ambulatory Visit: Admit: 2021-08-12 | Discharge: 2021-08-12 | Payer: BC Managed Care – HMO

## 2021-08-12 DIAGNOSIS — Z09 Encounter for follow-up examination after completed treatment for conditions other than malignant neoplasm: Secondary | ICD-10-CM

## 2021-08-12 DIAGNOSIS — E079 Disorder of thyroid, unspecified: Secondary | ICD-10-CM

## 2021-08-12 DIAGNOSIS — E785 Hyperlipidemia, unspecified: Secondary | ICD-10-CM

## 2021-08-12 DIAGNOSIS — M75101 Unspecified rotator cuff tear or rupture of right shoulder, not specified as traumatic: Secondary | ICD-10-CM

## 2021-08-12 DIAGNOSIS — Z9981 Dependence on supplemental oxygen: Secondary | ICD-10-CM

## 2021-08-12 DIAGNOSIS — M12811 Other specific arthropathies, not elsewhere classified, right shoulder: Secondary | ICD-10-CM

## 2021-08-12 DIAGNOSIS — I10 Essential (primary) hypertension: Secondary | ICD-10-CM

## 2021-09-01 ENCOUNTER — Encounter: Admit: 2021-09-01 | Discharge: 2021-09-01 | Payer: BC Managed Care – HMO

## 2021-09-28 ENCOUNTER — Encounter: Admit: 2021-09-28 | Discharge: 2021-09-28 | Payer: BC Managed Care – HMO

## 2021-09-28 NOTE — Telephone Encounter
-   Signed order faxed to [phone].

## 2021-09-30 ENCOUNTER — Encounter: Admit: 2021-09-30 | Discharge: 2021-09-30 | Payer: BC Managed Care – HMO

## 2021-09-30 ENCOUNTER — Ambulatory Visit: Admit: 2021-09-30 | Discharge: 2021-10-01 | Payer: BC Managed Care – HMO

## 2021-09-30 DIAGNOSIS — Z9981 Dependence on supplemental oxygen: Secondary | ICD-10-CM

## 2021-09-30 DIAGNOSIS — I10 Essential (primary) hypertension: Secondary | ICD-10-CM

## 2021-09-30 DIAGNOSIS — E079 Disorder of thyroid, unspecified: Secondary | ICD-10-CM

## 2021-09-30 DIAGNOSIS — E785 Hyperlipidemia, unspecified: Secondary | ICD-10-CM

## 2023-05-21 ENCOUNTER — Encounter: Admit: 2023-05-21 | Discharge: 2023-05-21 | Payer: Private Health Insurance - Indemnity

## 2023-05-31 ENCOUNTER — Encounter: Admit: 2023-05-31 | Discharge: 2023-05-31 | Payer: Private Health Insurance - Indemnity

## 2023-05-31 DIAGNOSIS — M25511 Pain in right shoulder: Secondary | ICD-10-CM

## 2023-06-06 ENCOUNTER — Encounter: Admit: 2023-06-06 | Discharge: 2023-06-06 | Payer: Private Health Insurance - Indemnity

## 2023-06-06 ENCOUNTER — Ambulatory Visit: Admit: 2023-06-06 | Discharge: 2023-06-07 | Payer: Private Health Insurance - Indemnity
